# Patient Record
Sex: Female | Born: 1965 | Race: Black or African American | Hispanic: No | Marital: Single | Smoking: Never smoker
Health system: Southern US, Community
[De-identification: ages and names within clinical notes are randomized; demographics above are authoritative.]

## PROBLEM LIST (undated history)

## (undated) DIAGNOSIS — D249 Benign neoplasm of unspecified breast: Secondary | ICD-10-CM

## (undated) DIAGNOSIS — K589 Irritable bowel syndrome without diarrhea: Secondary | ICD-10-CM

## (undated) DIAGNOSIS — C801 Malignant (primary) neoplasm, unspecified: Secondary | ICD-10-CM

## (undated) HISTORY — DX: Malignant (primary) neoplasm, unspecified: C80.1

## (undated) HISTORY — DX: Benign neoplasm of unspecified breast: D24.9

## (undated) HISTORY — DX: Irritable bowel syndrome without diarrhea: K58.9

## (undated) HISTORY — PX: DILATION AND CURETTAGE OF UTERUS: SHX78

---

## 2001-05-26 HISTORY — PX: CHOLECYSTECTOMY: SHX55

## 2001-05-26 HISTORY — PX: OTHER SURGICAL HISTORY: SHX169

## 2001-07-08 ENCOUNTER — Encounter (INDEPENDENT_AMBULATORY_CARE_PROVIDER_SITE_OTHER): Payer: Self-pay | Admitting: Specialist

## 2001-07-08 ENCOUNTER — Observation Stay (HOSPITAL_COMMUNITY): Admission: RE | Admit: 2001-07-08 | Discharge: 2001-07-09 | Payer: Self-pay | Admitting: Gynecology

## 2001-08-15 ENCOUNTER — Emergency Department (HOSPITAL_COMMUNITY): Admission: EM | Admit: 2001-08-15 | Discharge: 2001-08-15 | Payer: Self-pay | Admitting: Emergency Medicine

## 2001-08-15 ENCOUNTER — Encounter: Payer: Self-pay | Admitting: Emergency Medicine

## 2003-03-02 ENCOUNTER — Other Ambulatory Visit: Admission: RE | Admit: 2003-03-02 | Discharge: 2003-03-02 | Payer: Self-pay | Admitting: Diagnostic Radiology

## 2004-03-25 ENCOUNTER — Other Ambulatory Visit: Admission: RE | Admit: 2004-03-25 | Discharge: 2004-03-25 | Payer: Self-pay | Admitting: Gynecology

## 2004-03-28 ENCOUNTER — Encounter: Admission: RE | Admit: 2004-03-28 | Discharge: 2004-03-28 | Payer: Self-pay | Admitting: Gynecology

## 2004-09-17 ENCOUNTER — Other Ambulatory Visit: Admission: RE | Admit: 2004-09-17 | Discharge: 2004-09-17 | Payer: Self-pay | Admitting: Gynecology

## 2004-09-30 ENCOUNTER — Encounter: Admission: RE | Admit: 2004-09-30 | Discharge: 2004-09-30 | Payer: Self-pay | Admitting: Gynecology

## 2005-04-29 ENCOUNTER — Other Ambulatory Visit: Admission: RE | Admit: 2005-04-29 | Discharge: 2005-04-29 | Payer: Self-pay | Admitting: *Deleted

## 2005-05-06 ENCOUNTER — Encounter: Admission: RE | Admit: 2005-05-06 | Discharge: 2005-05-06 | Payer: Self-pay | Admitting: Gynecology

## 2005-11-11 ENCOUNTER — Encounter: Admission: RE | Admit: 2005-11-11 | Discharge: 2005-11-11 | Payer: Self-pay | Admitting: Gynecology

## 2006-05-26 DIAGNOSIS — C801 Malignant (primary) neoplasm, unspecified: Secondary | ICD-10-CM

## 2006-05-26 HISTORY — DX: Malignant (primary) neoplasm, unspecified: C80.1

## 2006-05-26 HISTORY — PX: THYROIDECTOMY: SHX17

## 2006-12-03 ENCOUNTER — Other Ambulatory Visit: Admission: RE | Admit: 2006-12-03 | Discharge: 2006-12-03 | Payer: Self-pay | Admitting: Family Medicine

## 2006-12-08 ENCOUNTER — Encounter: Admission: RE | Admit: 2006-12-08 | Discharge: 2006-12-08 | Payer: Self-pay | Admitting: Family Medicine

## 2007-02-11 ENCOUNTER — Ambulatory Visit (HOSPITAL_COMMUNITY): Admission: RE | Admit: 2007-02-11 | Discharge: 2007-02-12 | Payer: Self-pay | Admitting: General Surgery

## 2007-02-11 ENCOUNTER — Encounter (INDEPENDENT_AMBULATORY_CARE_PROVIDER_SITE_OTHER): Payer: Self-pay | Admitting: General Surgery

## 2007-07-01 ENCOUNTER — Emergency Department (HOSPITAL_COMMUNITY): Admission: EM | Admit: 2007-07-01 | Discharge: 2007-07-01 | Payer: Self-pay | Admitting: Family Medicine

## 2007-12-07 ENCOUNTER — Other Ambulatory Visit: Admission: RE | Admit: 2007-12-07 | Discharge: 2007-12-07 | Payer: Self-pay | Admitting: Family Medicine

## 2007-12-09 ENCOUNTER — Encounter: Admission: RE | Admit: 2007-12-09 | Discharge: 2007-12-09 | Payer: Self-pay | Admitting: Family Medicine

## 2008-11-08 ENCOUNTER — Ambulatory Visit: Payer: Self-pay | Admitting: Gynecology

## 2008-12-11 ENCOUNTER — Encounter: Admission: RE | Admit: 2008-12-11 | Discharge: 2008-12-11 | Payer: Self-pay | Admitting: Gynecology

## 2008-12-13 ENCOUNTER — Ambulatory Visit: Payer: Self-pay | Admitting: Gynecology

## 2008-12-13 ENCOUNTER — Encounter: Payer: Self-pay | Admitting: Gynecology

## 2008-12-13 ENCOUNTER — Other Ambulatory Visit: Admission: RE | Admit: 2008-12-13 | Discharge: 2008-12-13 | Payer: Self-pay | Admitting: Gynecology

## 2008-12-19 ENCOUNTER — Ambulatory Visit: Payer: Self-pay | Admitting: Gynecology

## 2008-12-20 ENCOUNTER — Ambulatory Visit: Payer: Self-pay | Admitting: Gynecology

## 2008-12-20 ENCOUNTER — Inpatient Hospital Stay (HOSPITAL_COMMUNITY): Admission: RE | Admit: 2008-12-20 | Discharge: 2008-12-22 | Payer: Self-pay | Admitting: Obstetrics and Gynecology

## 2008-12-20 ENCOUNTER — Encounter: Payer: Self-pay | Admitting: Gynecology

## 2008-12-20 HISTORY — PX: ABDOMINAL HYSTERECTOMY: SHX81

## 2008-12-25 ENCOUNTER — Ambulatory Visit: Payer: Self-pay | Admitting: Gynecology

## 2009-01-03 ENCOUNTER — Ambulatory Visit: Payer: Self-pay | Admitting: Gynecology

## 2009-01-12 ENCOUNTER — Encounter: Admission: RE | Admit: 2009-01-12 | Discharge: 2009-01-12 | Payer: Self-pay | Admitting: Internal Medicine

## 2009-01-31 ENCOUNTER — Ambulatory Visit: Payer: Self-pay | Admitting: Gynecology

## 2010-01-31 ENCOUNTER — Encounter: Admission: RE | Admit: 2010-01-31 | Discharge: 2010-01-31 | Payer: Self-pay | Admitting: Gynecology

## 2010-06-07 ENCOUNTER — Emergency Department (HOSPITAL_COMMUNITY)
Admission: EM | Admit: 2010-06-07 | Discharge: 2010-06-07 | Payer: Self-pay | Source: Home / Self Care | Admitting: Emergency Medicine

## 2010-06-24 ENCOUNTER — Other Ambulatory Visit: Payer: Self-pay | Admitting: Family Medicine

## 2010-06-24 DIAGNOSIS — M79604 Pain in right leg: Secondary | ICD-10-CM

## 2010-06-26 ENCOUNTER — Encounter: Payer: Self-pay | Admitting: Internal Medicine

## 2010-06-26 ENCOUNTER — Inpatient Hospital Stay: Admission: RE | Admit: 2010-06-26 | Payer: Self-pay | Source: Ambulatory Visit

## 2010-07-02 ENCOUNTER — Ambulatory Visit
Admission: RE | Admit: 2010-07-02 | Discharge: 2010-07-02 | Disposition: A | Discharge status: Home / Self Care | Payer: Managed Care, Other (non HMO) | Source: Ambulatory Visit | Attending: Family Medicine | Admitting: Family Medicine

## 2010-07-02 ENCOUNTER — Other Ambulatory Visit: Payer: Self-pay | Admitting: Family Medicine

## 2010-07-02 ENCOUNTER — Other Ambulatory Visit: Payer: Self-pay

## 2010-07-02 DIAGNOSIS — M79604 Pain in right leg: Secondary | ICD-10-CM

## 2010-09-01 LAB — PREGNANCY, URINE: Preg Test, Ur: NEGATIVE

## 2010-09-01 LAB — CBC
Hemoglobin: 10.5 g/dL — ABNORMAL LOW (ref 12.0–15.0)
MCHC: 33.7 g/dL (ref 30.0–36.0)
RBC: 3.68 MIL/uL — ABNORMAL LOW (ref 3.87–5.11)

## 2010-09-10 ENCOUNTER — Other Ambulatory Visit: Payer: Self-pay | Admitting: Internal Medicine

## 2010-09-10 DIAGNOSIS — C73 Malignant neoplasm of thyroid gland: Secondary | ICD-10-CM

## 2010-09-18 ENCOUNTER — Ambulatory Visit
Admission: RE | Admit: 2010-09-18 | Discharge: 2010-09-18 | Disposition: A | Discharge status: Home / Self Care | Payer: Managed Care, Other (non HMO) | Source: Ambulatory Visit | Attending: Internal Medicine | Admitting: Internal Medicine

## 2010-09-18 DIAGNOSIS — C73 Malignant neoplasm of thyroid gland: Secondary | ICD-10-CM

## 2010-10-08 NOTE — H&P (Signed)
NAME:  Peggy Sawyer, Peggy Sawyer               ACCOUNT NO.:  [id number]   MEDICAL RECORD NO.:  [id number]          PATIENT TYPE:  OIB   LOCATION:  0098                         FACILITY:  [facility]   PHYSICIAN:  Adolph Pollack, M.D.DATE OF BIRTH:  1965-10-17   DATE OF ADMISSION:  02/11/2007  DATE OF DISCHARGE:                              HISTORY & PHYSICAL   REASON:  Thyroidectomy.   HISTORY OF PRESENT ILLNESS:  This is a 45 year old female who has a  palpable right thyroid nodule.  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  She also has a number of nodular lesions in her left  lobe.  I have known her for over a year and recommended that she have  thyroidectomy, but she had other family issues  to take care of, and she  came back the summer of 2008 and now would like to have her surgery.   PAST MEDICAL HISTORY:  1. 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 Gastroesophageal reflux.  3. Hemorrhoids.  4. Gallbladder disease.   PREVIOUS OPERATIONS:  Removal of uterine fibroids; laparoscopic  cholecystectomy.   ALLERGIES:  HYDROCODONE.   MEDICATIONS:  Advil p.r.n., Maalox p.r.n., Tums p.r.n., Pepcid p.r.n.,  multivitamin.   SOCIAL HISTORY:  There is no tobacco use.  Occasional alcohol use.   FAMILY HISTORY:  Notable for hypertension, heart disease, diabetes,  prostate cancer in her brother.   REVIEW OF SYSTEMS:  She has had a little bit of fatigue.   PHYSICAL EXAMINATION:  GENERAL:  A well-developed, well-nourished female  in no acute distress.  She is pleasant and cooperative.  VITAL SIGNS: Temperature is 98.5 degrees, blood pressure is 93/62, pulse  64, respirations 12, O2 saturations on room air 99%.  NECK:  Supple with a palpable right lower neck mass consistent with a  thyroid mass.  No cervical adenopathy.  RESPIRATORY:  Breath sounds equal and clear.  Respirations unlabored.  CARDIOVASCULAR:  Regular rate, regular rhythm.  No murmur heard.  ABDOMEN:  Soft,  nontender.  Small scars noted.  EXTREMITIES:  SCD hose are on.   IMPRESSION:  Hurthle cell neoplasm right lobe of thyroid gland as well  as multiple nodules in the left lobe of thyroid gland.   PLAN:  Total thyroidectomy.  We discussed the procedure and risks  preoperatively.      Adolph Pollack, M.D.  Electronically Signed     TJR/MEDQ  D:  02/11/2007  T:  02/11/2007  Job:  [id number]

## 2010-10-08 NOTE — Op Note (Signed)
NAME:  Peggy Sawyer, Peggy Sawyer               ACCOUNT NO.:  [id number]   MEDICAL RECORD NO.:  [id number]          PATIENT TYPE:  INP   LOCATION:  9302                          FACILITY:  [facility]   PHYSICIAN:  Juan H. Lily Peer, M.D.DATE OF BIRTH:  1965/06/15   DATE OF PROCEDURE:  12/20/2008  DATE OF DISCHARGE:                               OPERATIVE REPORT   SURGEON:  Juan H. Lily Peer, MD   FIRST ASSISTANT:  Rande Brunt. Gottsegen, MD   INDICATIONS FOR OPERATION:  A 45 year old gravida 2, para 1, AB 1 with  symptomatic leiomyomatous uteri contributing to dysmenorrhea,  menorrhagia, and iron-deficiency anemia.   PREOPERATIVE DIAGNOSES:  1. Leiomyomatous uteri.  2. Dysmenorrhea.  3. Menorrhagia.  4. Iron deficiency anemia.   POSTOPERATIVE DIAGNOSES:  1. Leiomyomatous uteri.  2. Dysmenorrhea.  3. Menorrhagia.  4. Iron deficiency anemia.   ANESTHESIA:  General endotracheal anesthesia.   PROCEDURE PERFORMED:  Total abdominal hysterectomy.   FINDINGS:  The patient with 47-20 weeks size multilobulated uterus with  normal-appearing tubes and ovaries.   DESCRIPTION OF OPERATION:  After the patient was adequately counseled,  she was taken to the operating room where she underwent successful  general endotracheal anesthesia.  She had PAS stockings placed for DVT  prophylaxis and also had received a gram of cefotetan IV as well.  After  general endotracheal anesthesia was obtained, the abdomen and perineum  were prepped and draped in usual sterile fashion.  A Foley catheter had  been inserted in effort to monitor urinary output.  A Pfannenstiel  incision was made 2 cm above the symphysis pubis.  The incision was  carried out through skin and subcutaneous tissue down to the rectus  fascia.  A midline nick was made.  The fascia was incised in a  transverse fashion.  The peritoneal cavity was entered cautiously.  The  patient was placed in Trendelenburg position and the O'Connor-O'Sullivan  retractors were placed for retraction.  It was evident that the uterus  appeared to be sucked in with the fibroid as well and posterior cul-de-  sac.  Once the pressure was released, tension and traction was able to  be placed on the uterus to be able to visualize the adnexa.  The right  round ligament was identified, was suture ligated with 0 Vicryl suture  and transected with the Bovie and the anterior bladder flap was  established by incising the flap to the area of the internal cervical  os.  The right tube and ovary were identified and the Heaney clamp was  placed at the proximal junction of the utero-ovarian ligament and  fallopian tube and was transected.  The tube and ovary were left in  place and then the pedicle was secured with a free tie of 0 Vicryl  suture followed by transfixion stitch of 0 Vicryl suture.  Meticulous  dissection in the right parametrium was accomplished with serial  clamping and cutting, and suture ligated with 0 Vicryl suture as we made  our way down from the cardinal and broad ligaments to the level of the  lower uterine segment and  incorporating the uterine arteries which were  clamped, cut and suture ligated with 0 Vicryl suture.  Similar procedure  was carried out on the contralateral side due to the size of the  fibroid, uterus for additional exposure to allow [place] to remove the cervix  and the uterus was amputated and passed over the operative field.  The  remaining cervix was meticulously freed from the bladder anteriorly and  right angles of Heaney clamps were placed and with the use of Jorgenson  scissors, the cervix was excised off the vagina.  Both angles were  secured independently with 0 Vicryl suture in transfixion stitch manner  and the remaining center portion of the vaginal cuff was secured with  figure-of-eight of 0 Vicryl suture.  The pelvic cavity was copiously  irrigated with normal saline solution.  Hemostasis was accomplished with  the  Bovie of small vessels as well as the Arista powder applied as a  hemostatic agent.  Once this was accomplished, the sponge and needle  count were removed and the visceral peritoneum was not reapproximated  with the rectus fascia, was closed with running stitch of 0 Vicryl  suture.  The subcutaneous bleeders were Bovie cauterized.  Skin was  reapproximated with skin clips followed by placing Xeroform gauze and  4x4 dressing.  Prior to putting the dressing on, 0.25% Marcaine was  infiltrated into the incision for a total of 10 mL for postoperative  analgesia.  The patient was extubated and transferred to recovery room  in stable vital signs.  Blood loss was 400 mL.  Urine output 200 mL.  IV  fluids consisted 2500 mL of lactated Ringer's.  Sponge and needle count  were correct.  There weight of the uterus and cervix was 1476.4 g.      Juan H. Lily Peer, M.D.  Electronically Signed     JHF/MEDQ  D:  12/20/2008  T:  12/21/2008  Job:  [id number]

## 2010-10-08 NOTE — H&P (Signed)
NAME:  Peggy Sawyer, Peggy Sawyer               ACCOUNT NO.:  [id number]   MEDICAL RECORD NO.:  [id number]          PATIENT TYPE:  AMB   LOCATION:  SDC                           FACILITY:  [facility]   PHYSICIAN:  Juan H. Lily Peer, M.D.DATE OF BIRTH:  11-03-1965   DATE OF ADMISSION:  DATE OF DISCHARGE:                              HISTORY & PHYSICAL   CHIEF COMPLAINT:  1. Symptomatic leiomyomatous uterus.  2. Dysmenorrhea.  3. Menorrhagia.  4. Iron-deficiency anemia.   HISTORY:  The patient is a 45 year old, gravida 2, gravida 2, para 1, AB 1, who was  seen in the office for preoperative consultation on December 13, 2008.  The  patient had been referred to our practice from [facility] back  on November 09, 2008.  The patient had originally been scheduled for total  abdominal hysterectomy as a result of a symptomatic leiomyomatous uterus  which contributed through dysmenorrhea, menorrhagia, iron-deficiency  anemia, but had to postpone it due to scheduling complex until this  month.  She had an ultrasound that was done in the office on November 08, 2008, which demonstrated she had a 19 x 12 x 9.6 cm uterus with multiple  fibroids with the following measurements 10 x 10 cm, 5 x 5 cm, 2.9 x 1.8  cm, 2.4 x 1.9 cm, and 5.0 x 5.3 cm, and 1.8 x 1.3 cm.  The ovaries had  appeared to be normal.  Endometrial stripe was 8.3.  The patient had  been instructed to continue to be taking her iron supplementation daily.  Her last CBC in the office on November 08, 2008, had demonstrated hemoglobin  and hematocrit of 15.2 and 43.1 with a platelet count of 298,000.   PAST MEDICAL HISTORY:  The patient states that she gets nightmares on  codeine.  She has had one vaginal delivery.  She has had one D and E for  an elective AB.  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 Sharl Ma.  She also had resectoscopic  myomectomy in  2003 for menorrhagia.   FAMILY HISTORY:  Mother with history of stroke and hypertension.  Several members in her family with hypertension and diabetes.   PHYSICAL EXAMINATION:  VITAL SIGNS:  Average weight 190 pounds and blood  pressure was 118/72.  HEENT:  Unremarkable.  NECK:  Supple.  Trachea midline.  No carotid bruits.  No thyromegaly.  LUNGS:  Clear to auscultation without any rhonchi or wheezes.  HEART:  Regular rate and rhythm.  No murmurs or gallops.  BREASTS:  Both breasts are symmetrical in appearance.  No skin  discoloration, nipple inversion, palpable masses, or tenderness.  No  supraclavicular or axillary lymphadenopathy.  ABDOMEN:  Soft and nontender.  Fundal height measuring approximately 6-  18 week size.  PELVIC:  Bartholin, urethra, and Skene's within normal limits.  Vagina  and cervix, no gross lesions on inspection.  Uterus 19 weeks' size.  Difficult to determine the ovaries because of the size of uterus.  RECTAL:  Deferred.   ASSESSMENT:  A 45 year old, gravida 2, para 1, AB 1, with symptomatic  leiomyomatous uteri contributing to dysmenorrhea, menorrhagia, iron-  deficiency anemia and scheduled to undergo total abdominal hysterectomy  with ovarian conservation on Wednesday, December 20, 2008, at [facility].  The pros and cons of the procedure were discussed in detail  with the patient in the office.  Potential risk of deep vein thrombosis  and pulmonary embolism were discussed.  She will have PSA stockings for  prophylaxis.  Also, she will receive antibiotics intravenously for  prophylaxis as well.  Also, in the event of any bleeding that may  require blood transfusion, the patient is fully aware of 100,000 risk of  anaphylactic reaction, hepatitis, or AIDS and also the risk of potential  injury to internal organs have been discussed as well.  I had spoken  with Dr. Talmage Coin, the patient's physician who has been monitoring  her thyroid disease.  The  patient's last thyroid function test here in  the office had demonstrated a TSH of 0.30 which is slightly below the  normal range, but he had determined that this TSH is within desire goal  (0.3-2.0 international units per mL) for management of her thyroid  cancer.  Since she has slight suppression of TSH and an elective surgery  scheduled, he recommended that she hold off her levothyroxine which she  will do the day before her surgery and resume her current dose on the  day after her surgery.  He did not expect any perioperative complication  directly related to her thyroid hormone therapy and will relate this  also to anesthesia.  Her last hemoglobin/hematocrit in the office on  December 13, 2008, was 14.2 and 39.8, platelet count 292,000.  All questions  were answered.  We will follow her accordingly.   PLAN:  The patient is scheduled for total abdominal hysterectomy with  ovarian conservation on Wednesday, December 20, 2008, at 7:30 a.m. at [facility].      Darlington H. Lily Peer, M.D.  Electronically Signed     JHF/MEDQ  D:  12/19/2008  T:  12/19/2008  Job:  [id number]

## 2010-10-08 NOTE — Op Note (Signed)
NAME:  Peggy Sawyer, Peggy Sawyer               ACCOUNT NO.:  [id number]   MEDICAL RECORD NO.:  [id number]          PATIENT TYPE:  OIB   LOCATION:  0098                         FACILITY:  [facility]   PHYSICIAN:  Adolph Pollack, M.D.DATE OF BIRTH:  1966/03/22   DATE OF PROCEDURE:  02/11/2007  DATE OF DISCHARGE:                               OPERATIVE REPORT   PREOPERATIVE DIAGNOSIS:  Hurthle cell neoplasm of thyroid gland.   POSTOPERATIVE DIAGNOSIS:  Hurthle cell neoplasm of thyroid gland.   PROCEDURE:  Total thyroidectomy.   SURGEON:  Adolph Pollack, M.D.   ASSISTANT:  Ovidio Kin, M.D.   ANESTHESIA:  General.   INDICATIONS:  This 45 year old female has needle biopsy-proven Hurthle  cell neoplasm of the right lobe of the thyroid gland as well as multiple  left thyroid nodules and now presents for thyroidectomy.   TECHNIQUE:  She is seen in the holding area and then brought to the  operating room, placed supine on the operating table and a general  anesthetic was administered.  The neck was placed in slight extension.  The neck and upper chest were sterilely prepped and draped.  A  curvilinear incision was made superior to the clavicles, overlying the  skin, subcutaneous tissue and the platysmal muscle.  Subplatysmal flaps  were then raised inferiorly to the sternal notch and superiorly to the  thyroid cartilage.  The precervical fascia was identified in the midline  and divided.  The strap muscles were dissected free from the right lobe  of thyroid gland and the mass.  The inferior thyroidal vessels were  identified and divided close to the thyroid gland, mobilizing the  inferior aspect of the right lobe of thyroid gland.  I then approached  the superior aspect of right lobe of thyroid gland and dissected out the  vessels, divided them between clips.  The suspensory ligament of Allyson Sabal  was carefully mobilized.  I stayed above the plane of the recurrent  laryngeal nerve.  I identified  the inferior parathyroid gland and  mobilized it free from the thyroid, keeping its lateral blood supply  intact, and it remained viable throughout the case.  I thought I also  saw superior thyroid gland as well on the right side.  I carefully  identified the middle thyroidal vessels and divided them close to the  thyroid gland and then was able to dissect the thyroid gland free from  the trachea up toward the isthmus and then remove it and sent it off the  field.  I then further mobilized the isthmus.   The left side was then approached.  The strap muscles were dissected  free from the left lobe of thyroid gland with at least 2 or 3 firm  nodules in it.  I began with the inferior aspect of the left lobe of  thyroid gland, staying close to the capsule and identified the inferior  vessels which were divided.  I then approached the superior aspect of  thyroid gland and identified the superior thyroidal vessels.  Staying on  the thyroid capsule, I isolated the vessels and divided them  as well,  mobilizing the superior aspect.  I identified the recurrent laryngeal  nerve on this side because of thyroid had a little bit more of posterior  extension to it.  I carefully dissected the thyroid free from the  ligament of Berry, clipping small vessels while keeping the recurrent  laryngeal nerve in view.  I then identified the middle thyroidal vessels  and divided them close to the thyroid capsule, allowing me to mobilize  the thyroid onto the trachea.  It was then dissected off the trachea  with cautery.  The right lobe of the thyroid was marked with a suture,  and both right and left lobes and isthmus were sent to pathology.   Following this, I inspected the areas of both sides, and hemostasis was  adequate.  I irrigated out the wounds.  Surgicel was placed in both  sides of the wound.  I then close the strap muscles with interrupted 3-0  Vicryl sutures.  The platysma muscle was closed with  interrupted 3-0  Vicryl sutures.  The skin was closed with a 4-0 Monocryl subcuticular  stitch followed by Steri-Strips and sterile dressings.   She tolerated the procedure well without any apparent complications and  was taken to recovery room in satisfactory condition.      Adolph Pollack, M.D.  Electronically Signed     TJR/MEDQ  D:  02/11/2007  T:  02/11/2007  Job:  [id number]   cc:   Chales Salmon. Abigail Miyamoto, M.D.  Fax: [phone]

## 2010-10-08 NOTE — Discharge Summary (Signed)
NAME:  Peggy Sawyer, Peggy Sawyer               ACCOUNT NO.:  [id number]   MEDICAL RECORD NO.:  [id number]          PATIENT TYPE:  INP   LOCATION:  9302                          FACILITY:  [facility]   PHYSICIAN:  Juan H. Lily Peer, M.D.DATE OF BIRTH:  [birth year]   DATE OF ADMISSION:  12/20/2008  DATE OF DISCHARGE:  12/22/2008                               DISCHARGE SUMMARY   Total days hospitalized for 2.   HISTORY:  This patient is a 45 year old gravida 2, para 1, AB 1 with  symptomatic leiomyomatous uteri contributing dysmenorrhea, menorrhagia  and iron-deficiency anemia, underwent a total abdominal hysterectomy on  the morning of July 28, whereby an 18 to 20-week size uterus was noted  at time of surgery with a weight of 1476.4 g.  The patient did well  intraoperatively, had lost approximately 400 mL intraoperatively.  Her  preoperative hemoglobin had been 14.2, hematocrit 39.8, platelet count  282,000.  First postoperative day, her hemoglobin and hematocrit were  10.5, 31.5 respectively with a platelet count of 184,000.  She did well.  Every 24 hours her Foley catheter was removed.  Her PCA pump was  removed.  She was started on Percocet 1 tablet every 4-6 hours.  She was  also had her Foley catheter removed, had been voiding spontaneously and  ambulating and her diet was gradually advanced to the point that she had  regular diet on her second postoperative day and was spontaneously  passing gas.  Her vital signs continued to be stable.  Her incision site  was intact and she was ready to be discharged home.   FINAL DIAGNOSES:  1. Leiomyomatous uteri.  2. Dysmenorrhea.  3. Menorrhagia.  4. Iron deficiency anemia.   PROCEDURE PERFORMED:  Total abdominal hysterectomy.   FINAL DISPOSITION:  Followup.  The patient was discharged home.  As her  second postoperative day, she was up, ambulating, tolerating regular  diet well.  She is to return back to the office in 72 hours to have her  staples  removed.  She was given a prescription of Darvocet to take 1  p.o. q.4-6 h. p.r.n. and Reglan 10 mg 1 p.o. q.4-6 h. p.r.n. and also  because of some pruritus that she had developed from her Percocet, she  was given prescription of Benadryl 25 mg take 1 p.o. q.4-6 h. p.r.n.      Juan H. Lily Peer, M.D.  Electronically Signed     JHF/MEDQ  D:  12/22/2008  T:  12/23/2008  Job:  [id number]

## 2010-10-11 NOTE — H&P (Signed)
[facility]  Patient:    Peggy Sawyer, Peggy Sawyer Visit Number: [id number] MRN: [id number]          Service Type: Attending:  Douglass Rivers, M.D. Dictated by:   Douglass Rivers, M.D. Adm. Date:  07/08/01                           History and Physical  CHIEF COMPLAINT:              Submucosal fibroid.  HISTORY OF PRESENT ILLNESS:   The patient is a 45 year old G2, P1 with a history of postcoital bleeding, menorrhagia, and dysmenorrhea.  She is noted to have a multi-fibroid uterus.  A sonohysterogram was performed that showed a large endometrial mass that filled the entire cavity measuring 3 x 2.5 x 1.7 cm felt to be most consistent with a subserosal fibroid.  The patient states that on her heaviest days of flow she will change a super plus tampon every one to one and a half hours, which she does for two days.  She also notices an increase amount of clotting.  She and her husband are contemplating another child.  PAST OBSTETRICAL/GYNECOLOGIC HISTORY:                      Significant for regular Pap smears, normal.  One vaginal delivery 14 years ago.  Monthly cycles, condoms for contraception.  PAST MEDICAL HISTORY:         Negative.  PAST SURGICAL HISTORY:        Negative.  MEDICATIONS:                  Depot Lupron.  SOCIAL HISTORY:               No tobacco or alcohol.  Rare caffeine.  No formal exercise.  FAMILY HISTORY:               Negative for cancer.  PHYSICAL EXAMINATION:  GENERAL:                      She is a well-appearing female in no acute distress.  HEENT:                        Unremarkable.  NECK:                         Thyroid is midline, tender, and mobile.  HEART:                        Regular rate.  LUNGS:                        Clear to auscultation.  BREASTS:                      Without mass, discharge, retraction in supine and upright position.  ABDOMEN:                      Soft and nontender without rebound or  guarding.  GYNECOLOGIC:                  Normal external female genitalia.  The BUS is negative.  The vagina is pink and moist.  The cervix is without lesion.  The uterus is difficult to  palpate.  There was some minimal suprapubic tenderness. The adnexa were without fullness.  Rectovaginal exam was remarkable only for extensive hemorrhoids.  LABORATORY DATA:              Ultrasound was performed.  It showed the uterine cavity to be 9 x 5.1 x 5.9 with several hypoechoic foci and intramural fibroids, all less than 24 mm.  The sonohysterogram portion revealed the large endometrial mass which consumed the entire cavity measuring 3.1 x 2.4 x 1.7 cm.  ASSESSMENT:                   Endometrial mass, probable endometrial fibroid, with postcoital bleeding and menorrhagia.  PLAN:                         The patient was suppressed for three months on Lupron and will present today for a hysteroscopic resection of the fibroid with a Versipoint.  Risks and benefits of the procedure were discussed including risk of uterine perforation, damage to underlying bowel and bladder, risk of damage to the transecting vessels, risk of blood clots, possible need for conversion to hysterectomy.  All questions were addressed.  She will present in the morning of February 13. Dictated by:   Douglass Rivers, M.D. Attending:  Douglass Rivers, M.D. DD:  07/07/01 TD:  07/07/01 Job: [id number]

## 2010-10-11 NOTE — Op Note (Signed)
[facility]  Patient:    Peggy Sawyer, Peggy Sawyer Visit Number: [id number] MRN: [id number]          Service Type: DSU Location: [facility] Attending Physician:  Douglass Rivers Dictated by:   Douglass Rivers, M.D. Proc. Date: 07/08/01 Admit Date:  07/08/2001                             Operative Report  PREOPERATIVE DIAGNOSIS:       Submucosal fibroid and menorrhagia.  POSTOPERATIVE DIAGNOSES:      Submucosal fibroid and menorrhagia.  PROCEDURE:                    Hysteroscopic myomectomy with resectoscope and vaportrobe.  SURGEON:                      Douglass Rivers, M.D., Gaetano Hawthorne. Lily Peer, M.D.  ANESTHESIA:                   General.  I&O DEFICIT:                  Sorbitol solution is roughly 100 cc.  FINDINGS:                     Large posterior wall fibroid filling the uterine cavity.  COMPLICATIONS:                None.  PATHOLOGY:                    Fibroid.  PROCEDURE:                    Patient was taken to the operating room, placed in the dorsal lithotomy position, prepped and draped in usual sterile fashion after anesthesia.  A bivalve speculum was placed in the vagina.  The cervix was visualized and stable with a single tooth tenaculum.  A sterile weighted speculum was then placed in the vagina and the uterus was gently sounded to 8 cm.  The cervix was then gently dilated up to 23 [place] and the diagnostic hysteroscope was advanced occupational therapy the cervix.  We were able to visualize that we were able to get around the fibroid.  There was some difficulty clearing debris from the cavity but as we were going to move on to the next step the diagnostic scope was removed and the patient was then future dilated up to 29 [place].  The operative hysteroscope was then advanced through the cervix with the vaportrobe attachment.  The uterine cavity was eventually cleared of blood.  The fibroid was noted to be markedly softened.  We were able to  extend the scope to just superior from the fibroid.  The fibroid was scored gently with a vaportrobe which was set at 200 watts cutting.  A second pass was then performed.  Because the fibroid occupied almost the entire cavity and extended deeply into the lower uterine segment, it was difficult to extend the sleeve without falling out of the uterine cavity and into the cervix and vagina.  We placed the hysteroscope.  There was noted to be a moderate amount of bleeding.  The patient had Lupron, had been off about two weeks.  We did not feel comfortable because of the difficulty in visualization of proceeding with the vaportrobe.  There were multiple sinuses.  The vaportrobe was then removed and  ball cautery was placed necessitating coagulation of the sinuses in and around the fibroid so that we could better visualize.  Prior to doing this, however, suction curette to remove some of the debris was performed after which the cautery was brought down to 80 and the sinuses were individually treated as to not cause too much scarring of the uterine cavity.  This took considerable amount of time.  After we felt that we could visualize, the hysteroscopic loop was then placed on the scope.  The cautery was brought up to 120.  A few passes of the loop were performed. However, because of the bulk of the fibroid and the difficulty in clearing debris, we abandoned this and went back to the vaportrobe.  The particulate was removed with polyp forceps.  The vaportrobe was then replaced and again cut and cautery were brought up to 200.  The fibroid was then gently removed with the vaportrobe with careful attention to the posterior wall.  Once we got more of the fibroid out, again the vaportrobe was removed and the loop was placed.  We were able to rotate the loop and come from the under side and laterally through the fibroid so that we were not dissecting down onto the posterior wall.  The instruments were  then removed.  Polyp forceps were placed in again and curetting was gently performed and it was felt that we had cleared the cavity of the majority, if not entirely, of the fibroid.  There was a good amount of bleeding from sinuses that were again somewhat spot treated but in order to minimize any damage to scarring to the cavity we elected instead to end the procedure and instead place a Foley in the uterine cavity in order to tamponade the walls.  The Foley was filled with 15 cc.  The tip had been cut off prior to placement.  The cervix was inspected and was hemostatic.  The instruments were all then removed.  The patient tolerated the procedure well.  Sponge, lap, and needle counts were correct x2.  She was transferred to the PACU in stable condition. Dictated by:   Douglass Rivers, M.D. Attending Physician:  Douglass Rivers DD:  07/08/01 TD:  07/08/01 Job: [id number]

## 2011-02-14 ENCOUNTER — Other Ambulatory Visit: Payer: Self-pay | Admitting: Family Medicine

## 2011-02-14 DIAGNOSIS — Z1231 Encounter for screening mammogram for malignant neoplasm of breast: Secondary | ICD-10-CM

## 2011-02-18 ENCOUNTER — Ambulatory Visit
Admission: RE | Admit: 2011-02-18 | Discharge: 2011-02-18 | Disposition: A | Discharge status: Home / Self Care | Payer: Managed Care, Other (non HMO) | Source: Ambulatory Visit | Attending: Family Medicine | Admitting: Family Medicine

## 2011-02-18 DIAGNOSIS — Z1231 Encounter for screening mammogram for malignant neoplasm of breast: Secondary | ICD-10-CM

## 2011-03-06 LAB — COMPREHENSIVE METABOLIC PANEL
BUN: 6
Calcium: 9.4
Creatinine, Ser: 0.72
GFR calc Af Amer: >60
GFR calc non Af Amer: >60
Glucose, Bld: 85
Sodium: 142
Total Bilirubin: 0.5
Total Protein: 6.8

## 2011-03-06 LAB — CALCIUM
Calcium: 8.7
Calcium: 8.9

## 2011-03-06 LAB — DIFFERENTIAL
Eosinophils Absolute: 0.2
Eosinophils Relative: 3
Monocytes Absolute: 0.6
Monocytes Relative: 9
Neutro Abs: 4.1

## 2011-03-06 LAB — CBC
HCT: 39.9
MCHC: 33.7
RBC: 4.81
RDW: 13.6
WBC: 7.3

## 2011-03-11 ENCOUNTER — Emergency Department (HOSPITAL_COMMUNITY)
Admission: EM | Admit: 2011-03-11 | Discharge: 2011-03-11 | Disposition: A | Discharge status: Home / Self Care | Payer: Managed Care, Other (non HMO) | Attending: Emergency Medicine | Admitting: Emergency Medicine

## 2011-03-11 ENCOUNTER — Emergency Department (HOSPITAL_COMMUNITY): Payer: Managed Care, Other (non HMO)

## 2011-03-11 DIAGNOSIS — J40 Bronchitis, not specified as acute or chronic: Secondary | ICD-10-CM | POA: Insufficient documentation

## 2011-03-11 DIAGNOSIS — K219 Gastro-esophageal reflux disease without esophagitis: Secondary | ICD-10-CM | POA: Insufficient documentation

## 2011-03-11 DIAGNOSIS — R51 Headache: Secondary | ICD-10-CM | POA: Insufficient documentation

## 2011-03-11 DIAGNOSIS — Z9889 Other specified postprocedural states: Secondary | ICD-10-CM | POA: Insufficient documentation

## 2011-03-11 DIAGNOSIS — Z79899 Other long term (current) drug therapy: Secondary | ICD-10-CM | POA: Insufficient documentation

## 2011-03-11 DIAGNOSIS — R0609 Other forms of dyspnea: Secondary | ICD-10-CM | POA: Insufficient documentation

## 2011-03-11 DIAGNOSIS — R059 Cough, unspecified: Secondary | ICD-10-CM | POA: Insufficient documentation

## 2011-03-11 DIAGNOSIS — R05 Cough: Secondary | ICD-10-CM | POA: Insufficient documentation

## 2011-03-11 DIAGNOSIS — R0789 Other chest pain: Secondary | ICD-10-CM | POA: Insufficient documentation

## 2011-03-11 DIAGNOSIS — Z85850 Personal history of malignant neoplasm of thyroid: Secondary | ICD-10-CM | POA: Insufficient documentation

## 2011-03-11 DIAGNOSIS — R0989 Other specified symptoms and signs involving the circulatory and respiratory systems: Secondary | ICD-10-CM | POA: Insufficient documentation

## 2011-03-11 DIAGNOSIS — R110 Nausea: Secondary | ICD-10-CM | POA: Insufficient documentation

## 2011-03-11 LAB — BASIC METABOLIC PANEL
Calcium: 9.5 mg/dL (ref 8.4–10.5)
Creatinine, Ser: 0.73 mg/dL (ref 0.50–1.10)
GFR calc Af Amer: >90 mL/min (ref >90)
GFR calc non Af Amer: >90 mL/min (ref >90)
Sodium: 139 mEq/L (ref 135–145)

## 2011-03-11 LAB — CBC
HCT: 40.9 % (ref 36.0–46.0)
MCHC: 33 g/dL (ref 30.0–36.0)
Platelets: 183 10*3/uL (ref 150–400)
RDW: 13.2 % (ref 11.5–15.5)
WBC: 13 10*3/uL — ABNORMAL HIGH (ref 4.0–10.5)

## 2011-03-11 LAB — DIFFERENTIAL
Basophils Absolute: 0.1 10*3/uL (ref 0.0–0.1)
Basophils Relative: 0 % (ref 0–1)
Eosinophils Relative: 1 % (ref 0–5)
Lymphocytes Relative: 12 % (ref 12–46)
Monocytes Absolute: 0.7 10*3/uL (ref 0.1–1.0)

## 2011-03-11 LAB — POCT I-STAT TROPONIN I: Troponin i, poc: 0 ng/mL (ref 0.00–0.08)

## 2012-03-31 ENCOUNTER — Other Ambulatory Visit: Payer: Self-pay | Admitting: Family Medicine

## 2012-03-31 DIAGNOSIS — Z1231 Encounter for screening mammogram for malignant neoplasm of breast: Secondary | ICD-10-CM

## 2012-05-10 ENCOUNTER — Ambulatory Visit
Admission: RE | Admit: 2012-05-10 | Discharge: 2012-05-10 | Disposition: A | Discharge status: Home / Self Care | Payer: Managed Care, Other (non HMO) | Source: Ambulatory Visit | Attending: Family Medicine | Admitting: Family Medicine

## 2012-05-10 DIAGNOSIS — Z1231 Encounter for screening mammogram for malignant neoplasm of breast: Secondary | ICD-10-CM

## 2012-05-14 ENCOUNTER — Ambulatory Visit: Payer: Managed Care, Other (non HMO) | Admitting: Gynecology

## 2013-09-26 ENCOUNTER — Other Ambulatory Visit: Payer: Self-pay | Admitting: Internal Medicine

## 2013-09-26 DIAGNOSIS — C73 Malignant neoplasm of thyroid gland: Secondary | ICD-10-CM

## 2013-09-30 ENCOUNTER — Ambulatory Visit
Admission: RE | Admit: 2013-09-30 | Discharge: 2013-09-30 | Disposition: A | Discharge status: Home / Self Care | Payer: Managed Care, Other (non HMO) | Source: Ambulatory Visit | Attending: Internal Medicine | Admitting: Internal Medicine

## 2013-09-30 DIAGNOSIS — C73 Malignant neoplasm of thyroid gland: Secondary | ICD-10-CM

## 2014-02-02 ENCOUNTER — Other Ambulatory Visit: Payer: Self-pay

## 2014-02-02 DIAGNOSIS — Z1231 Encounter for screening mammogram for malignant neoplasm of breast: Secondary | ICD-10-CM

## 2014-02-14 ENCOUNTER — Ambulatory Visit
Admission: RE | Admit: 2014-02-14 | Discharge: 2014-02-14 | Disposition: A | Discharge status: Home / Self Care | Payer: Managed Care, Other (non HMO) | Source: Ambulatory Visit

## 2014-02-14 DIAGNOSIS — Z1231 Encounter for screening mammogram for malignant neoplasm of breast: Secondary | ICD-10-CM

## 2015-04-05 ENCOUNTER — Other Ambulatory Visit: Payer: Self-pay

## 2015-04-05 DIAGNOSIS — Z1231 Encounter for screening mammogram for malignant neoplasm of breast: Secondary | ICD-10-CM

## 2015-04-25 ENCOUNTER — Ambulatory Visit
Admission: RE | Admit: 2015-04-25 | Discharge: 2015-04-25 | Disposition: A | Discharge status: Home / Self Care | Payer: Managed Care, Other (non HMO) | Source: Ambulatory Visit

## 2015-04-25 DIAGNOSIS — Z1231 Encounter for screening mammogram for malignant neoplasm of breast: Secondary | ICD-10-CM

## 2018-01-01 ENCOUNTER — Other Ambulatory Visit: Payer: Self-pay | Admitting: Emergency Medicine

## 2018-01-01 DIAGNOSIS — Z1231 Encounter for screening mammogram for malignant neoplasm of breast: Secondary | ICD-10-CM

## 2018-01-15 ENCOUNTER — Other Ambulatory Visit: Payer: Self-pay | Admitting: Physician Assistant

## 2018-01-15 DIAGNOSIS — K625 Hemorrhage of anus and rectum: Secondary | ICD-10-CM

## 2018-01-15 DIAGNOSIS — R1013 Epigastric pain: Secondary | ICD-10-CM

## 2018-01-15 DIAGNOSIS — R1030 Lower abdominal pain, unspecified: Secondary | ICD-10-CM

## 2018-01-28 ENCOUNTER — Ambulatory Visit: Payer: Managed Care, Other (non HMO)

## 2018-01-28 ENCOUNTER — Ambulatory Visit
Admission: RE | Admit: 2018-01-28 | Discharge: 2018-01-28 | Disposition: A | Discharge status: Home / Self Care | Payer: Managed Care, Other (non HMO) | Source: Ambulatory Visit | Attending: Emergency Medicine | Admitting: Emergency Medicine

## 2018-01-28 ENCOUNTER — Ambulatory Visit
Admission: RE | Admit: 2018-01-28 | Discharge: 2018-01-28 | Disposition: A | Discharge status: Home / Self Care | Payer: Managed Care, Other (non HMO) | Source: Ambulatory Visit | Attending: Physician Assistant | Admitting: Physician Assistant

## 2018-01-28 DIAGNOSIS — K625 Hemorrhage of anus and rectum: Secondary | ICD-10-CM

## 2018-01-28 DIAGNOSIS — R1030 Lower abdominal pain, unspecified: Secondary | ICD-10-CM

## 2018-01-28 DIAGNOSIS — Z1231 Encounter for screening mammogram for malignant neoplasm of breast: Secondary | ICD-10-CM

## 2018-01-28 DIAGNOSIS — R1013 Epigastric pain: Secondary | ICD-10-CM

## 2018-01-28 MED ORDER — IOPAMIDOL (ISOVUE-300) INJECTION 61%
100.0000 mL | Freq: Once | INTRAVENOUS | Status: AC | PRN
  Administered 2018-01-28: 100 mL via INTRAVENOUS

## 2018-02-03 ENCOUNTER — Other Ambulatory Visit: Payer: Self-pay | Admitting: Physician Assistant

## 2018-02-03 DIAGNOSIS — R911 Solitary pulmonary nodule: Secondary | ICD-10-CM

## 2018-02-03 DIAGNOSIS — R9389 Abnormal findings on diagnostic imaging of other specified body structures: Secondary | ICD-10-CM

## 2018-02-10 ENCOUNTER — Ambulatory Visit
Admission: RE | Admit: 2018-02-10 | Discharge: 2018-02-10 | Disposition: A | Discharge status: Home / Self Care | Payer: Managed Care, Other (non HMO) | Source: Ambulatory Visit | Attending: Physician Assistant | Admitting: Physician Assistant

## 2018-02-10 DIAGNOSIS — R911 Solitary pulmonary nodule: Secondary | ICD-10-CM

## 2018-02-10 DIAGNOSIS — R9389 Abnormal findings on diagnostic imaging of other specified body structures: Secondary | ICD-10-CM

## 2018-02-10 MED ORDER — IOPAMIDOL (ISOVUE-300) INJECTION 61%
75.0000 mL | Freq: Once | INTRAVENOUS | Status: AC | PRN
  Administered 2018-02-10: 75 mL via INTRAVENOUS

## 2018-06-10 ENCOUNTER — Other Ambulatory Visit: Payer: Self-pay | Admitting: Family Medicine

## 2018-06-10 DIAGNOSIS — R911 Solitary pulmonary nodule: Secondary | ICD-10-CM

## 2018-06-15 ENCOUNTER — Ambulatory Visit
Admission: RE | Admit: 2018-06-15 | Discharge: 2018-06-15 | Disposition: A | Discharge status: Home / Self Care | Payer: Managed Care, Other (non HMO) | Source: Ambulatory Visit | Attending: Family Medicine | Admitting: Family Medicine

## 2018-06-15 DIAGNOSIS — R911 Solitary pulmonary nodule: Secondary | ICD-10-CM

## 2019-02-24 ENCOUNTER — Other Ambulatory Visit: Payer: Self-pay | Admitting: Family Medicine

## 2019-02-24 DIAGNOSIS — Z1231 Encounter for screening mammogram for malignant neoplasm of breast: Secondary | ICD-10-CM

## 2019-04-13 ENCOUNTER — Ambulatory Visit
Admission: RE | Admit: 2019-04-13 | Discharge: 2019-04-13 | Disposition: A | Discharge status: Home / Self Care | Payer: Managed Care, Other (non HMO) | Source: Ambulatory Visit | Attending: Family Medicine | Admitting: Family Medicine

## 2019-04-13 ENCOUNTER — Other Ambulatory Visit: Payer: Self-pay

## 2019-04-13 DIAGNOSIS — Z1231 Encounter for screening mammogram for malignant neoplasm of breast: Secondary | ICD-10-CM

## 2019-06-02 ENCOUNTER — Ambulatory Visit: Payer: Managed Care, Other (non HMO) | Attending: Internal Medicine

## 2019-06-02 DIAGNOSIS — Z20822 Contact with and (suspected) exposure to covid-19: Secondary | ICD-10-CM

## 2019-06-04 LAB — NOVEL CORONAVIRUS, NAA: SARS-CoV-2, NAA: NOT DETECTED

## 2019-06-08 ENCOUNTER — Other Ambulatory Visit: Payer: Managed Care, Other (non HMO)

## 2019-07-08 IMAGING — CT CT CHEST W/ CM
1 series · 15 of 34 positions shown, 19 images · IV contrast (APPLIED)
Comparison: CT abdomen pelvis 01/28/2018.

CLINICAL DATA: Lung nodule.

EXAM:
CT CHEST WITH CONTRAST
TECHNIQUE: Multidetector CT imaging of the chest was performed during
intravenous contrast administration.
CONTRAST:  75mL [id number] IOPAMIDOL ([id number]) INJECTION 61%

[Series 2: chest w/cm · axial · 0.69mm/px · z∈[-337,-71]mm · 15 of 157 slices shown, 19 images]
[im 12/157  mediastinal]
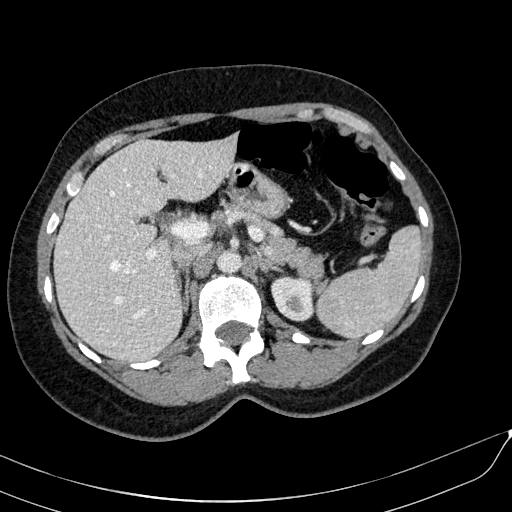
[im 12/157  lung]
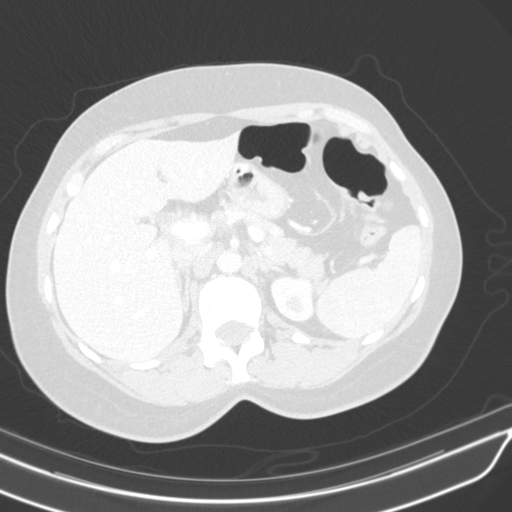
[im 24/157  lung]
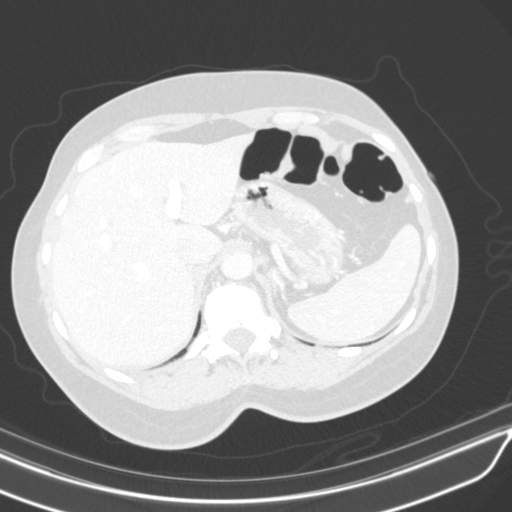
[im 32/157  lung]
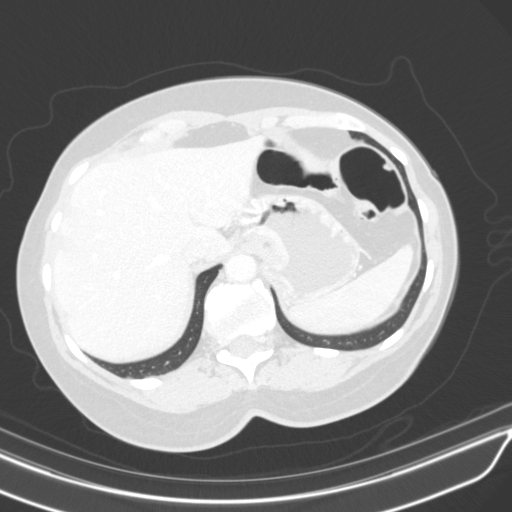
[im 41/157  lung]
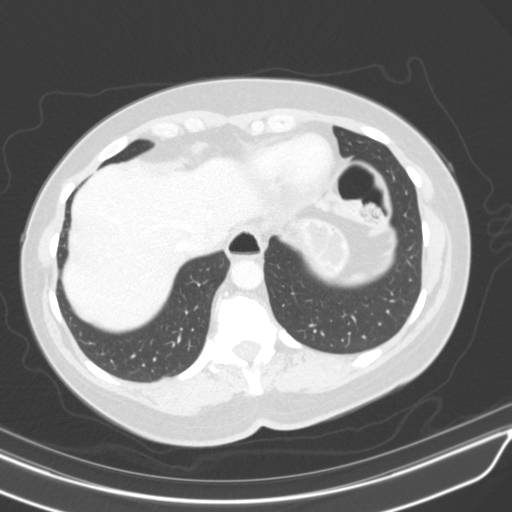
[im 53/157  mediastinal]
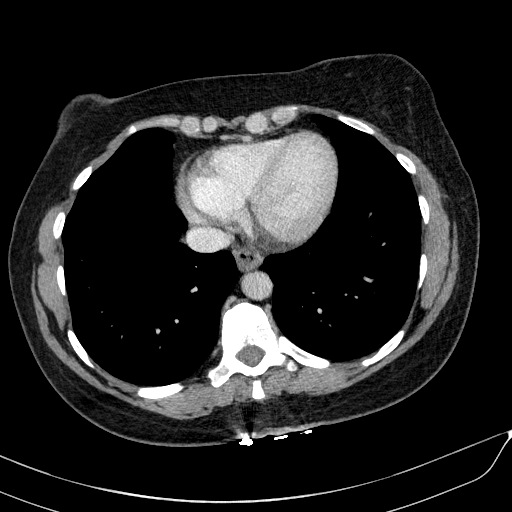
[im 53/157  lung]
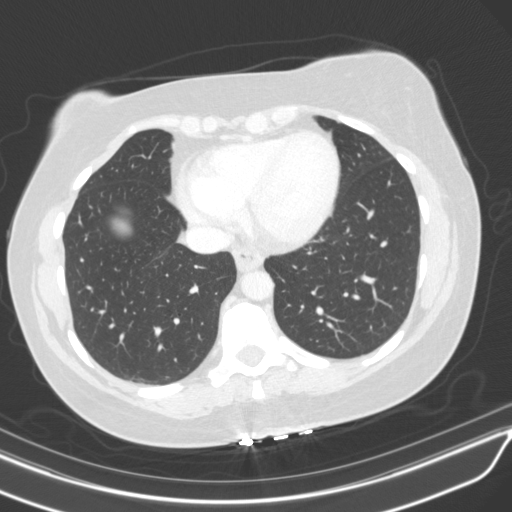
[im 63/157  lung]
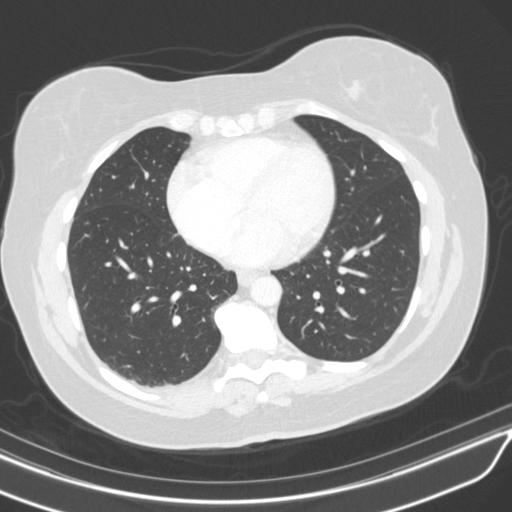
[im 70/157  lung]
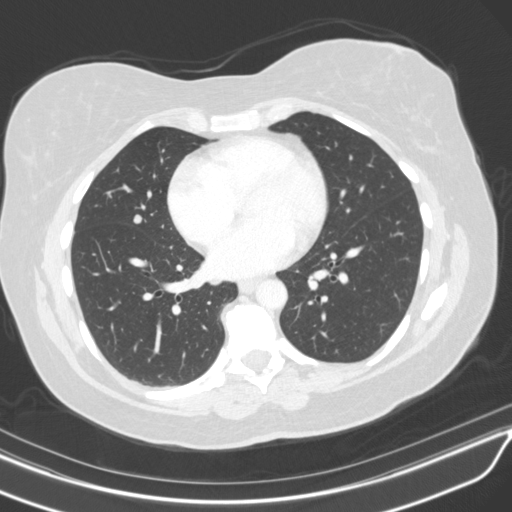
[im 81/157  lung]
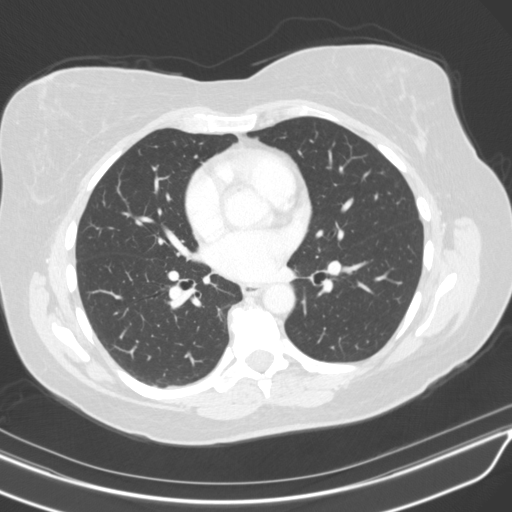
[im 87/157  mediastinal]
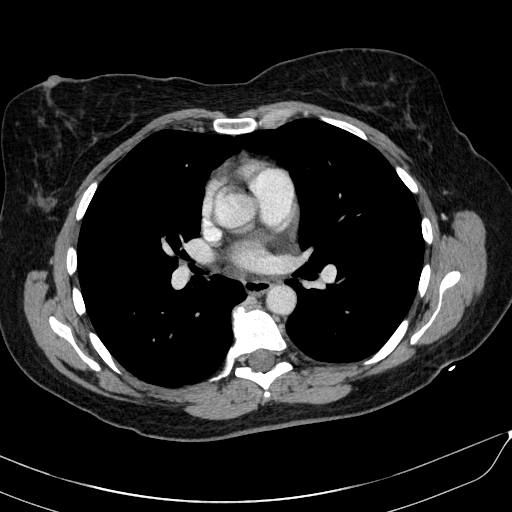
[im 87/157  lung]
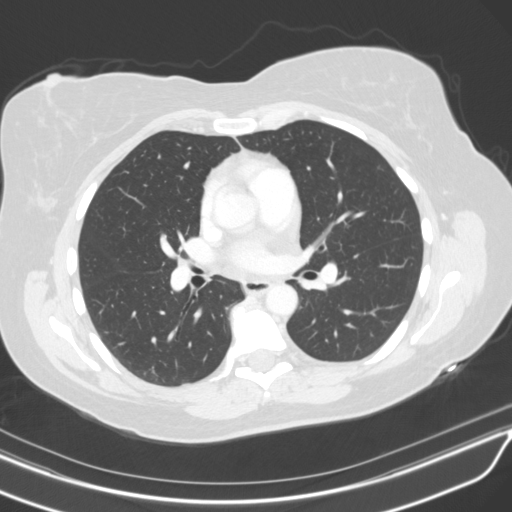
[im 94/157  lung]
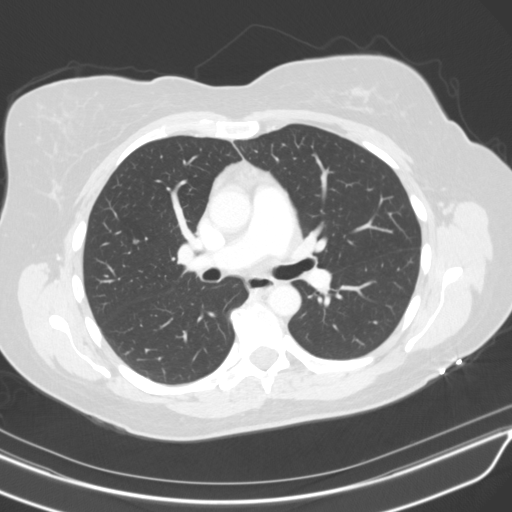
[im 105/157  lung]
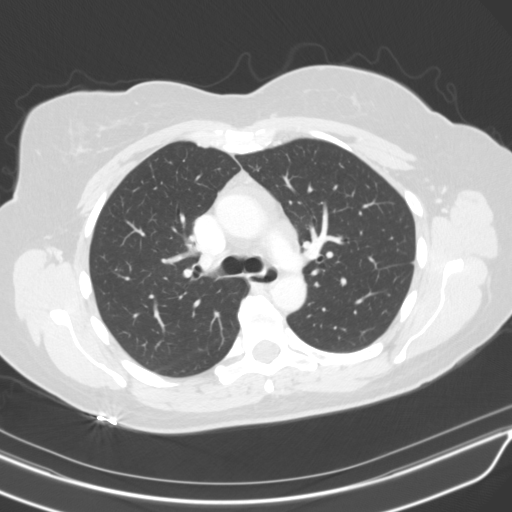
[im 116/157  lung]
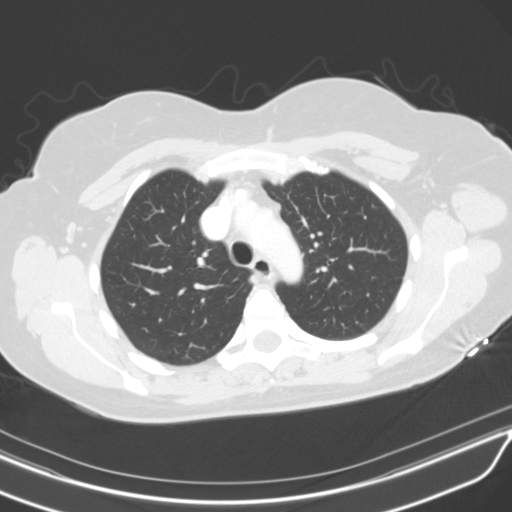
[im 125/157  mediastinal]
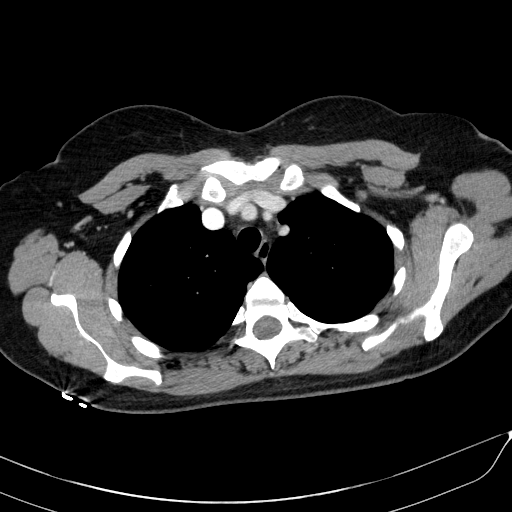
[im 125/157  lung]
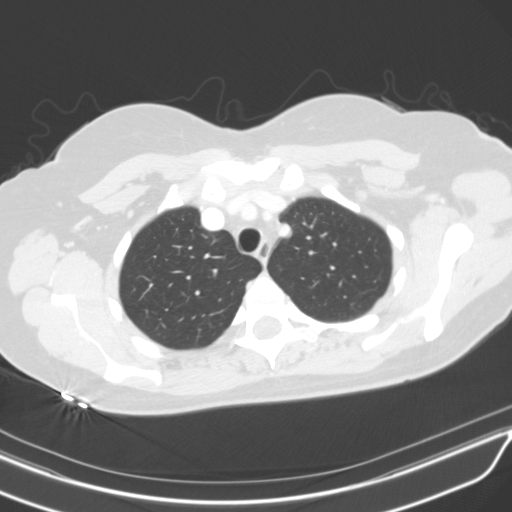
[im 133/157  lung]
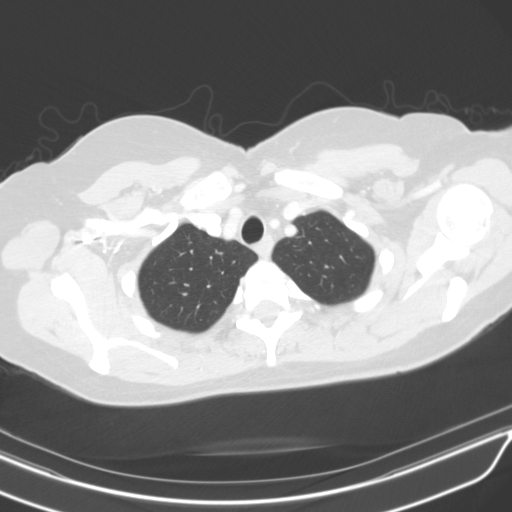
[im 145/157  lung]
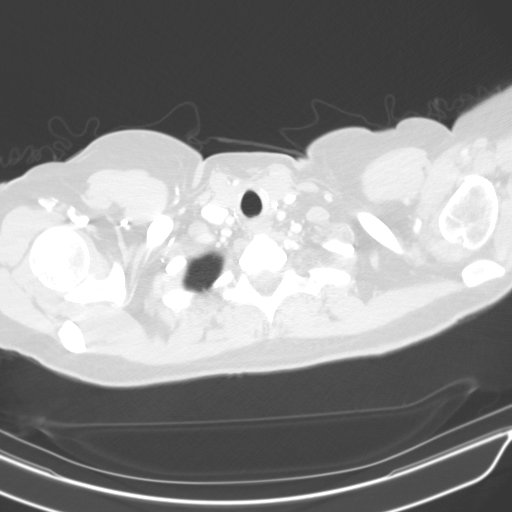

[15 of 34 positions shown; findings below may reference images not displayed]

FINDINGS: Cardiovascular: Vascular structures are unremarkable. Heart size
normal. No pericardial effusion.

Mediastinum/Nodes: Thyroidectomy. No pathologically enlarged
mediastinal, hilar or axillary lymph nodes. Probable thymic tissue
in the prevascular space. Esophagus is grossly unremarkable.

Lungs/Pleura: Right lung nodules measure up to 7 mm in the right
middle lobe. No pleural fluid. Airway is unremarkable.

Upper Abdomen: Visualized portions of the liver, adrenal glands,
left kidney, spleen, pancreas, stomach and bowel are grossly
unremarkable. No upper abdominal adenopathy.

Musculoskeletal: Degenerative changes in the spine. No worrisome
lytic or sclerotic lesions.
IMPRESSION: Right lung nodules measure 7 mm or less in size. This examination
will serve as baseline for future comparison. Non-contrast chest CT
at 3-6 months is recommended. If the nodules are stable at time of
repeat CT, then future CT at 18-24 months (from today's scan) is
considered optional for low-risk patients, but is recommended for
high-risk patients. This recommendation follows the consensus
statement: Guidelines for Management of Incidental Pulmonary Nodules
Detected on CT Images: From the [HOSPITAL] 3708; Radiology

## 2019-08-01 ENCOUNTER — Ambulatory Visit: Payer: Managed Care, Other (non HMO) | Attending: Internal Medicine

## 2019-08-01 DIAGNOSIS — Z23 Encounter for immunization: Secondary | ICD-10-CM | POA: Insufficient documentation

## 2019-08-01 NOTE — Progress Notes (Signed)
   Covid-19 Vaccination Clinic  Name:  JOYDAN BASTEDO    MRN: [id number] DOB: 1966/04/19  08/01/2019  Ms. Bernardy was observed post Covid-19 immunization for 15 minutes without incident. She was provided with Vaccine Information Sheet and instruction to access the V-Safe system.   Ms. Horgen was instructed to call 911 with any severe reactions post vaccine: Marland Kitchen Difficulty breathing  . Swelling of face and throat  . A fast heartbeat  . A bad rash all over body  . Dizziness and weakness   Immunizations Administered    Name Date Dose VIS Date Route   Pfizer COVID-19 Vaccine 08/01/2019  4:24 PM 0.3 mL 05/06/2019 Intramuscular   Manufacturer: [place]   Lot: [id number]   [place]: [id number]

## 2019-08-29 ENCOUNTER — Ambulatory Visit: Payer: Managed Care, Other (non HMO) | Attending: Internal Medicine

## 2019-08-29 DIAGNOSIS — Z23 Encounter for immunization: Secondary | ICD-10-CM

## 2019-08-29 NOTE — Progress Notes (Signed)
   Covid-19 Vaccination Clinic  Name:  Peggy Sawyer    MRN: [id number] DOB: June 08, 1965  08/29/2019  Peggy Sawyer was observed post Covid-19 immunization for 15 minutes without incident. She was provided with Vaccine Information Sheet and instruction to access the V-Safe system.   Peggy Sawyer was instructed to call 911 with any severe reactions post vaccine: Marland Kitchen Difficulty breathing  . Swelling of face and throat  . A fast heartbeat  . A bad rash all over body  . Dizziness and weakness   Immunizations Administered    Name Date Dose VIS Date Route   Pfizer COVID-19 Vaccine 08/29/2019  8:43 AM 0.3 mL 05/06/2019 Intramuscular   Manufacturer: [place]   Lot: [id number]   [place]: [id number]

## 2020-02-22 ENCOUNTER — Other Ambulatory Visit: Payer: Self-pay | Admitting: Family Medicine

## 2020-02-22 DIAGNOSIS — Z1231 Encounter for screening mammogram for malignant neoplasm of breast: Secondary | ICD-10-CM

## 2020-04-17 ENCOUNTER — Ambulatory Visit: Payer: Managed Care, Other (non HMO)

## 2020-04-23 ENCOUNTER — Other Ambulatory Visit: Payer: Self-pay | Admitting: Family Medicine

## 2020-04-23 DIAGNOSIS — R911 Solitary pulmonary nodule: Secondary | ICD-10-CM

## 2020-05-11 ENCOUNTER — Other Ambulatory Visit: Payer: Self-pay | Admitting: Physician Assistant

## 2020-05-11 DIAGNOSIS — R1032 Left lower quadrant pain: Secondary | ICD-10-CM

## 2020-05-31 ENCOUNTER — Ambulatory Visit
Admission: RE | Admit: 2020-05-31 | Discharge: 2020-05-31 | Disposition: A | Discharge status: Home / Self Care | Payer: Managed Care, Other (non HMO) | Source: Ambulatory Visit | Attending: Family Medicine | Admitting: Family Medicine

## 2020-05-31 ENCOUNTER — Other Ambulatory Visit: Payer: Managed Care, Other (non HMO)

## 2020-05-31 ENCOUNTER — Other Ambulatory Visit: Payer: Self-pay

## 2020-05-31 DIAGNOSIS — Z1231 Encounter for screening mammogram for malignant neoplasm of breast: Secondary | ICD-10-CM

## 2020-06-01 ENCOUNTER — Other Ambulatory Visit: Payer: Managed Care, Other (non HMO)

## 2020-06-04 ENCOUNTER — Ambulatory Visit
Admission: RE | Admit: 2020-06-04 | Discharge: 2020-06-04 | Disposition: A | Discharge status: Home / Self Care | Payer: Managed Care, Other (non HMO) | Source: Ambulatory Visit | Attending: Physician Assistant | Admitting: Physician Assistant

## 2020-06-04 ENCOUNTER — Ambulatory Visit
Admission: RE | Admit: 2020-06-04 | Discharge: 2020-06-04 | Disposition: A | Discharge status: Home / Self Care | Payer: Managed Care, Other (non HMO) | Source: Ambulatory Visit | Attending: Family Medicine | Admitting: Family Medicine

## 2020-06-04 DIAGNOSIS — R911 Solitary pulmonary nodule: Secondary | ICD-10-CM

## 2020-06-04 DIAGNOSIS — R1032 Left lower quadrant pain: Secondary | ICD-10-CM

## 2020-06-04 MED ORDER — IOPAMIDOL (ISOVUE-300) INJECTION 61%
100.0000 mL | Freq: Once | INTRAVENOUS | Status: AC | PRN
  Administered 2020-06-04: 100 mL via INTRAVENOUS

## 2021-04-30 ENCOUNTER — Other Ambulatory Visit: Payer: Self-pay | Admitting: Family Medicine

## 2021-04-30 DIAGNOSIS — Z1231 Encounter for screening mammogram for malignant neoplasm of breast: Secondary | ICD-10-CM

## 2021-06-03 ENCOUNTER — Ambulatory Visit: Payer: Managed Care, Other (non HMO)

## 2021-06-12 ENCOUNTER — Ambulatory Visit: Payer: Managed Care, Other (non HMO)

## 2021-07-04 ENCOUNTER — Ambulatory Visit
Admission: RE | Admit: 2021-07-04 | Discharge: 2021-07-04 | Disposition: A | Discharge status: Home / Self Care | Payer: Managed Care, Other (non HMO) | Source: Ambulatory Visit | Attending: Family Medicine | Admitting: Family Medicine

## 2021-07-04 DIAGNOSIS — Z1231 Encounter for screening mammogram for malignant neoplasm of breast: Secondary | ICD-10-CM

## 2022-03-28 ENCOUNTER — Other Ambulatory Visit: Payer: Self-pay

## 2022-03-28 ENCOUNTER — Emergency Department (HOSPITAL_COMMUNITY): Payer: Managed Care, Other (non HMO)

## 2022-03-28 ENCOUNTER — Encounter (HOSPITAL_COMMUNITY): Payer: Self-pay | Admitting: *Deleted

## 2022-03-28 ENCOUNTER — Inpatient Hospital Stay (HOSPITAL_COMMUNITY)
Admission: EM | Admit: 2022-03-28 | Discharge: 2022-03-30 | DRG: 812 | Disposition: A | Discharge status: Home / Self Care | Payer: Managed Care, Other (non HMO) | Attending: Internal Medicine | Admitting: Internal Medicine

## 2022-03-28 DIAGNOSIS — Z8042 Family history of malignant neoplasm of prostate: Secondary | ICD-10-CM | POA: Diagnosis not present

## 2022-03-28 DIAGNOSIS — K589 Irritable bowel syndrome without diarrhea: Secondary | ICD-10-CM | POA: Diagnosis present

## 2022-03-28 DIAGNOSIS — K644 Residual hemorrhoidal skin tags: Secondary | ICD-10-CM | POA: Diagnosis present

## 2022-03-28 DIAGNOSIS — K5900 Constipation, unspecified: Secondary | ICD-10-CM | POA: Diagnosis present

## 2022-03-28 DIAGNOSIS — Z85850 Personal history of malignant neoplasm of thyroid: Secondary | ICD-10-CM | POA: Diagnosis not present

## 2022-03-28 DIAGNOSIS — K922 Gastrointestinal hemorrhage, unspecified: Secondary | ICD-10-CM | POA: Insufficient documentation

## 2022-03-28 DIAGNOSIS — Z833 Family history of diabetes mellitus: Secondary | ICD-10-CM

## 2022-03-28 DIAGNOSIS — Z885 Allergy status to narcotic agent status: Secondary | ICD-10-CM | POA: Diagnosis not present

## 2022-03-28 DIAGNOSIS — D649 Anemia, unspecified: Secondary | ICD-10-CM | POA: Diagnosis not present

## 2022-03-28 DIAGNOSIS — E890 Postprocedural hypothyroidism: Secondary | ICD-10-CM | POA: Diagnosis present

## 2022-03-28 DIAGNOSIS — D509 Iron deficiency anemia, unspecified: Secondary | ICD-10-CM | POA: Diagnosis present

## 2022-03-28 DIAGNOSIS — Z823 Family history of stroke: Secondary | ICD-10-CM

## 2022-03-28 DIAGNOSIS — Z79899 Other long term (current) drug therapy: Secondary | ICD-10-CM | POA: Diagnosis not present

## 2022-03-28 DIAGNOSIS — Z8249 Family history of ischemic heart disease and other diseases of the circulatory system: Secondary | ICD-10-CM | POA: Diagnosis not present

## 2022-03-28 DIAGNOSIS — K648 Other hemorrhoids: Secondary | ICD-10-CM | POA: Diagnosis present

## 2022-03-28 DIAGNOSIS — Z79890 Hormone replacement therapy (postmenopausal): Secondary | ICD-10-CM

## 2022-03-28 LAB — PREPARE RBC (CROSSMATCH)

## 2022-03-28 LAB — COMPREHENSIVE METABOLIC PANEL
ALT: 28 U/L (ref 0–44)
AST: 27 U/L (ref 15–41)
Albumin: 4 g/dL (ref 3.5–5.0)
Alkaline Phosphatase: 49 U/L (ref 38–126)
Anion gap: 10 (ref 5–15)
BUN: 8 mg/dL (ref 6–20)
CO2: 24 mmol/L (ref 22–32)
Calcium: 9.2 mg/dL (ref 8.9–10.3)
Chloride: 104 mmol/L (ref 98–111)
Creatinine, Ser: 0.72 mg/dL (ref 0.44–1.00)
GFR, Estimated: >60 mL/min (ref >60)
Glucose, Bld: 98 mg/dL (ref 70–99)
Potassium: 4 mmol/L (ref 3.5–5.1)
Sodium: 138 mmol/L (ref 135–145)
Total Bilirubin: 0.2 mg/dL — ABNORMAL LOW (ref 0.3–1.2)
Total Protein: 6.7 g/dL (ref 6.5–8.1)

## 2022-03-28 LAB — CBC WITH DIFFERENTIAL/PLATELET
Abs Immature Granulocytes: 0.01 10*3/uL (ref 0.00–0.07)
Basophils Absolute: 0.1 10*3/uL (ref 0.0–0.1)
Basophils Relative: 1 %
Eosinophils Absolute: 0.2 10*3/uL (ref 0.0–0.5)
Eosinophils Relative: 3 %
HCT: 22 % — ABNORMAL LOW (ref 36.0–46.0)
Hemoglobin: 6.3 g/dL — CL (ref 12.0–15.0)
Immature Granulocytes: 0 %
Lymphocytes Relative: 37 %
Lymphs Abs: 2 10*3/uL (ref 0.7–4.0)
MCH: 20.3 pg — ABNORMAL LOW (ref 26.0–34.0)
MCHC: 28.6 g/dL — ABNORMAL LOW (ref 30.0–36.0)
MCV: 71 fL — ABNORMAL LOW (ref 80.0–100.0)
Monocytes Absolute: 0.5 10*3/uL (ref 0.1–1.0)
Monocytes Relative: 9 %
Neutro Abs: 2.8 10*3/uL (ref 1.7–7.7)
Neutrophils Relative %: 50 %
Platelets: 319 10*3/uL (ref 150–400)
RBC: 3.1 MIL/uL — ABNORMAL LOW (ref 3.87–5.11)
RDW: 17.2 % — ABNORMAL HIGH (ref 11.5–15.5)
WBC: 5.6 10*3/uL (ref 4.0–10.5)
nRBC: 0 % (ref 0.0–0.2)

## 2022-03-28 LAB — APTT: aPTT: 26 seconds (ref 24–36)

## 2022-03-28 LAB — ABO/RH: ABO/RH(D): A POS

## 2022-03-28 LAB — TROPONIN I (HIGH SENSITIVITY): Troponin I (High Sensitivity): <2 ng/L (ref <18)

## 2022-03-28 LAB — PROTIME-INR
INR: 1 (ref 0.8–1.2)
Prothrombin Time: 12.6 seconds (ref 11.4–15.2)

## 2022-03-28 MED ORDER — SODIUM CHLORIDE 0.9 % IV SOLN: 10.0000 mL/h | Freq: Once | INTRAVENOUS | Status: DC

## 2022-03-28 MED ORDER — POLYETHYLENE GLYCOL 3350 17 G PO PACK: 17.0000 g | PACK | Freq: Every day | ORAL | Status: DC | PRN

## 2022-03-28 MED ORDER — LEVOTHYROXINE SODIUM 100 MCG PO TABS
100.0000 ug | ORAL_TABLET | Freq: Every day | ORAL | Status: DC
  Administered 2022-03-29 – 2022-03-30 (×2): 100 ug via ORAL
  Filled 2022-03-28 (×2): qty 1

## 2022-03-28 MED ORDER — HYDROCORTISONE ACETATE 25 MG RE SUPP
25.0000 mg | Freq: Two times a day (BID) | RECTAL | Status: DC
  Administered 2022-03-29 – 2022-03-30 (×3): 25 mg via RECTAL
  Filled 2022-03-28 (×4): qty 1

## 2022-03-28 NOTE — ED Provider Triage Note (Signed)
Emergency Medicine Provider Triage Evaluation Note  ZAMARAH ULLMER , a 56 y.o. female  was evaluated in triage.  Pt complains of .  Feeling fatigued for the last week.  States that she has been more short of breath on exertion, but denies any chest pain abdominal pain.  Reports that she has a history of hemorrhoids, but previously had a hysterectomy.  No history of ulcerative colitis, but it had a endoscopic several years back that showed gastritis.  Denies any black stools or vaginal bleeding. Does have BRBPR with stools, but states normal for her given hemorrhoids.   Review of Systems  Positive: Shortness of breath Negative: Chest pain, abdominal pain  Physical Exam  Ht '5\' 7"'$  (1.702 m)   Wt 79.8 kg   BMI 27.57 kg/m  Gen:   Awake, no distress   Resp:  Normal effort  MSK:   Moves extremities without difficulty  Other:             No abdominal ttp  Medical Decision Making  Medically screening exam initiated at 3:35 PM.  Appropriate orders placed.  Lashe Oliveira Ruark was informed that the remainder of the evaluation will be completed by another provider, this initial triage assessment does not replace that evaluation, and the importance of remaining in the ED until their evaluation is complete.     Osvaldo Shipper, [place] 03/28/22 1606

## 2022-03-28 NOTE — ED Triage Notes (Signed)
Symptoms for one month

## 2022-03-28 NOTE — H&P (Signed)
History and Physical    Peggy Sawyer [id number] DOB: [birth year] DOA: 03/28/2022  PCP: Marda Stalker, PA-C (Confirm with patient/family/NH records and if not entered, this has to be entered at [facility] point of entry) Patient coming from: Home  I have personally briefly reviewed patient's old medical records in [place]  Chief Complaint: Feeling tired.  HPI: Peggy Sawyer is a 56 y.o. female with medical history significant of thyroid cancer status post thyroidectomy, IBS, chronic iron deficiency anemia secondary to hemorrhoidal bleeding, sent from PCPs office for evaluation of worsening of anemia.  Patient has had intermittent rectal bleeding associated with intermittent rectal pain, she reported worsening of her hemorrhoid bleeding recently along with frequent constipation.  She has had bright red rectal bleeding, blood dripping and blood coating stool up to 2-3 times a week. She could not tolerate MiraLAX due to her IBS symptoms.  She denies any lightheadedness or shortness of breath or chest pains.  Today, she went to see her PCP who did a CBC showed hemoglobin 6 and sent her to ED.  She has been feeling increasingly fatigued and generalized weakness for the past few weeks.  ED Course: Vitals are stable, no tachycardia no hypotension.  No hypoxia.  Repeat hemoglobin 6.3.  PRBC x2 is being given.  Review of Systems: As per HPI otherwise 14 point review of systems negative.    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 She has never used smokeless tobacco. She reports current drug use. No history on file for alcohol use.  Allergies  Allergen Reactions   Codeine     NIGHTMARES    Family History  Problem Relation Age of Onset   Hypertension Mother    Stroke Mother    Hypertension Father    Diabetes Sister    Cancer Brother        PROSTATE   Diabetes Maternal Aunt    Hypertension Maternal Aunt    Hypertension Maternal Uncle    Hypertension Maternal Grandmother    Hypertension Maternal Grandfather      Prior to Admission medications   Medication Sig Start Date End Date Taking? Authorizing Provider  levothyroxine (SYNTHROID) 100 MCG tablet Take by mouth.    [provider]    Physical Exam: Vitals:   03/28/22 1532 03/28/22 1536 03/28/22 1745 03/28/22 1800  BP:  108/73 (!) 119/55 94/63  Pulse:  83 82 71  Resp:  16 (!) 26 12  Temp:  98.3 F (36.8 C)    SpO2:  100% 99% 100%  Weight: 79.8 kg     Height: '5\' 7"'$  (1.702 m)       Constitutional: NAD, calm, comfortable Vitals:   03/28/22 1532 03/28/22 1536 03/28/22 1745 03/28/22 1800  BP:  108/73 (!) 119/55 94/63  Pulse:  83 82 71  Resp:  16 (!) 26 12  Temp:  98.3 F (36.8 C)    SpO2:  100% 99%  100%  Weight: 79.8 kg     Height: '5\' 7"'$  (1.702 m)      Eyes: PERRL, lids and conjunctivae normal ENMT: Mucous membranes are moist. Posterior pharynx clear of any exudate or lesions.Normal dentition.  Neck: normal, supple, no masses, no thyromegaly Respiratory: clear to auscultation bilaterally, no wheezing, no crackles. Normal respiratory effort. No accessory muscle use.  Cardiovascular: Regular rate and rhythm, no murmurs / rubs / gallops. No extremity edema. 2+ pedal pulses. No carotid bruits.  Abdomen: no tenderness, no masses palpated. No hepatosplenomegaly. Bowel sounds positive.  Musculoskeletal: no clubbing / cyanosis. No joint deformity upper and lower extremities. Good ROM, no contractures. Normal muscle tone.   Skin: no rashes, lesions, ulcers. No induration Neurologic: CN 2-12 grossly intact. Sensation intact, DTR normal. Strength 5/5 in all 4.  Psychiatric: Normal judgment and insight. Alert and oriented x 3. Normal mood.    Labs on Admission: I have personally reviewed following labs and imaging studies  CBC: Recent Labs  Lab 03/28/22 1545  WBC 5.6  NEUTROABS 2.8  HGB 6.3*  HCT 22.0*  MCV 71.0*  PLT 656   Basic Metabolic Panel: Recent Labs  Lab 03/28/22 1545  NA 138  K 4.0  CL 104  CO2 24  GLUCOSE 98  BUN 8  CREATININE 0.72  CALCIUM 9.2   GFR: Estimated Creatinine Clearance: 84.4 mL/min (by C-G formula based on SCr of 0.72 mg/dL). Liver Function Tests: Recent Labs  Lab 03/28/22 1545  AST 27  ALT 28  ALKPHOS 49  BILITOT 0.2*  PROT 6.7  ALBUMIN 4.0   No results for input(s): "LIPASE", "AMYLASE" in the last 168 hours. No results for input(s): "AMMONIA" in the last 168 hours. Coagulation Profile: Recent Labs  Lab 03/28/22 1545  INR 1.0   Cardiac Enzymes: No results for input(s): "CKTOTAL", "CKMB", "CKMBINDEX", "TROPONINI" in the last 168 hours. BNP (last 3 results) No results for input(s): "PROBNP" in the last 8760 hours. HbA1C: No results for input(s): "HGBA1C" in the last 72 hours. CBG: No results for input(s): "GLUCAP" in the last 168 hours. Lipid Profile: No results for input(s): "CHOL", "HDL", "LDLCALC", "TRIG", "CHOLHDL", "LDLDIRECT" in the last 72 hours. Thyroid Function Tests: No results for input(s): "TSH", "T4TOTAL", "FREET4", "T3FREE", "THYROIDAB" in the last 72 hours. Anemia Panel: No results for input(s): "VITAMINB12", "FOLATE", "FERRITIN", "TIBC", "IRON", "RETICCTPCT" in the last 72 hours. Urine analysis: No results found for: "COLORURINE", "APPEARANCEUR", "LABSPEC", "PHURINE", "GLUCOSEU", "HGBUR", "BILIRUBINUR", "KETONESUR", "PROTEINUR", "UROBILINOGEN", "NITRITE", "LEUKOCYTESUR"  Radiological Exams on Admission: DG Chest 1 View  Result  Date: 03/28/2022 CLINICAL DATA:  Dyspnea EXAM: CHEST  1 VIEW COMPARISON:  03/28/2022 at 1148 hours FINDINGS: The heart size and mediastinal contours are within normal limits. Both lungs are clear. The visualized skeletal structures are unremarkable. IMPRESSION: No active disease. Electronically Signed   By: Davina Poke D.O.   On: 03/28/2022 16:22    EKG: Ordered  Assessment/Plan Principal Problem:   Symptomatic anemia Active Problems:   Iron deficiency anemia   Lower GI bleed  (please populate well all problems here in Problem List. (For example, if patient is on BP meds at home and you resume or decide to hold them, it is a problem that needs to be her. Same for CAD, COPD, HLD and so on)  Symptomatic anemia, acute on chronic -Microcytic, 2/2 iron deficiency from chronic intermittent lower GI bleed from combined internal and external hemorrhoids. -PRBC x2, recheck H&H tonight and tomorrow morning -Start hydrocortisone suppository twice  daily -Check iron level, may need to resume iron supplement.  Patient agreed. [facility] consulted and will see the patient in AM.  Hypothyroid -Continue home dose of synthroid.   DVT prophylaxis: SCD Code Status: Full code Family Communication: None at bedside Disposition Plan: Expect 2 days of hospital stay for inpatient GI workup. Consults called: [facility] Admission status: Medsurg admit   Lequita Halt MD [facility] Pager [phone]  03/28/2022, 7:06 PM

## 2022-03-28 NOTE — ED Provider Notes (Signed)
[facility] EMERGENCY DEPARTMENT Provider Note   CSN: [id number] Arrival date & time: 03/28/22  1501     History  Chief Complaint  Patient presents with   iron low    Peggy Sawyer is a 56 y.o. female.  HPI   Patient presented to the ED for evaluation of a low blood count.  Patient states she has a history of intermittent rectal bleeding.  She has noticed blood in her stool recently.  Patient states previously she had a GI work-up and was told she had hemorrhoids.  Patient went to her doctor's office today and had her hemoglobin tested.  She was called back and notified that her hemoglobin was very low at 6.7 and she needed to come to hospital for blood transfusion.  Patient denies any trouble with chest pain or shortness of breath.  No abdominal pain.  Home Medications Prior to Admission medications   Not on File      Allergies    Codeine    Review of Systems   Review of Systems  Physical Exam Updated Vital Signs BP 94/63   Pulse 71   Temp 98.3 F (36.8 C)   Resp 12   Ht 1.702 m ('5\' 7"'$ )   Wt 79.8 kg   SpO2 100%   BMI 27.57 kg/m  Physical Exam Vitals and nursing note reviewed.  Constitutional:      General: She is not in acute distress.    Appearance: She is well-developed.  HENT:     Head: Normocephalic and atraumatic.     Right Ear: External ear normal.     Left Ear: External ear normal.  Eyes:     General: No scleral icterus.       Right eye: No discharge.        Left eye: No discharge.     Conjunctiva/sclera: Conjunctivae normal.  Neck:     Trachea: No tracheal deviation.  Cardiovascular:     Rate and Rhythm: Normal rate and regular rhythm.  Pulmonary:     Effort: Pulmonary effort is normal. No respiratory distress.     Breath sounds: Normal breath sounds. No stridor. No wheezing or rales.  Abdominal:     General: Bowel sounds are normal. There is no distension.     Palpations: Abdomen is soft.     Tenderness: There is no  abdominal tenderness. There is no guarding or rebound.  Genitourinary:    Comments: No gross blood noted on rectal exam Musculoskeletal:        General: No tenderness or deformity.     Cervical back: Neck supple.  Skin:    General: Skin is warm and dry.     Findings: No rash.  Neurological:     General: No focal deficit present.     Mental Status: She is alert.     Cranial Nerves: No cranial nerve deficit (no facial droop, extraocular movements intact, no slurred speech).     Sensory: No sensory deficit.     Motor: No abnormal muscle tone or seizure activity.     Coordination: Coordination normal.  Psychiatric:        Mood and Affect: Mood normal.     ED Results / Procedures / Treatments   Labs (all labs ordered are listed, but only abnormal results are displayed) Labs Reviewed  CBC WITH DIFFERENTIAL/PLATELET - Abnormal; Notable for the following components:      Result Value   RBC 3.10 (*)  Hemoglobin 6.3 (*)    HCT 22.0 (*)    MCV 71.0 (*)    MCH 20.3 (*)    MCHC 28.6 (*)    RDW 17.2 (*)    All other components within normal limits  COMPREHENSIVE METABOLIC PANEL - Abnormal; Notable for the following components:   Total Bilirubin 0.2 (*)    All other components within normal limits  PROTIME-INR  APTT  OCCULT BLOOD X 1 CARD TO LAB, STOOL  IRON AND TIBC  FERRITIN  RETICULOCYTES  TYPE AND SCREEN  ABO/RH  PREPARE RBC (CROSSMATCH)  TROPONIN I (HIGH SENSITIVITY)    EKG None  Radiology DG Chest 1 View  Result Date: 03/28/2022 CLINICAL DATA:  Dyspnea EXAM: CHEST  1 VIEW COMPARISON:  03/28/2022 at 1148 hours FINDINGS: The heart size and mediastinal contours are within normal limits. Both lungs are clear. The visualized skeletal structures are unremarkable. IMPRESSION: No active disease. Electronically Signed   By: Davina Poke D.O.   On: 03/28/2022 16:22    Procedures Procedures    Medications Ordered in ED Medications  0.9 %  sodium chloride infusion  (has no administration in time range)    ED Course/ Medical Decision Making/ A&P Clinical Course as of 03/28/22 1902  Ludwig Clarks Mar 28, 2022  1854 Case discussed with Dr. Roosevelt Locks regarding admission [JK]  1859 CBC with Differential(!!) Hemoglobin decreased 6.3 [JK]  1859 Comprehensive metabolic panel(!) Metabolic panel unremarkable [JK]    Clinical Course User Index [JK] Dorie Rank, MD                           Medical Decision Making Problems Addressed: Anemia, unspecified type: acute illness or injury that poses a threat to life or bodily functions  Amount and/or Complexity of Data Reviewed Labs: ordered. Decision-making details documented in ED Course.  Risk Prescription drug management. Decision regarding hospitalization.   Patient presented to the ED for evaluation of anemia.  Hemoglobin is decreased at 6.3.  She has noticed blood in her stool.  Concerned about the possibility of GI bleeding.  No gross blood noted on rectal exam.  Hemoccult is pending.  Patient is hemodynamically stable in no acute distress.  I have requested a GI consult.  I spoke with Dr. Roosevelt Locks and plan is admission to the hospital for further work-up.        Final Clinical Impression(s) / ED Diagnoses Final diagnoses:  Anemia, unspecified type    Rx / DC Orders ED Discharge Orders     None         Dorie Rank, MD 03/28/22 1902

## 2022-03-28 NOTE — ED Triage Notes (Signed)
The pt was sent here from her doctors office and she was told her iron was low and she was told to come to the ed for maybe a blood transfusion

## 2022-03-29 LAB — CBC
HCT: 28.7 % — ABNORMAL LOW (ref 36.0–46.0)
Hemoglobin: 8.8 g/dL — ABNORMAL LOW (ref 12.0–15.0)
MCH: 22.6 pg — ABNORMAL LOW (ref 26.0–34.0)
MCHC: 30.7 g/dL (ref 30.0–36.0)
MCV: 73.8 fL — ABNORMAL LOW (ref 80.0–100.0)
Platelets: 264 10*3/uL (ref 150–400)
RBC: 3.89 MIL/uL (ref 3.87–5.11)
RDW: 18 % — ABNORMAL HIGH (ref 11.5–15.5)
WBC: 4.8 10*3/uL (ref 4.0–10.5)
nRBC: 0 % (ref 0.0–0.2)

## 2022-03-29 LAB — HIV ANTIBODY (ROUTINE TESTING W REFLEX): HIV Screen 4th Generation wRfx: NONREACTIVE

## 2022-03-29 LAB — IRON AND TIBC
Iron: 45 ug/dL (ref 28–170)
Saturation Ratios: 8 % — ABNORMAL LOW (ref 10.4–31.8)
TIBC: 536 ug/dL — ABNORMAL HIGH (ref 250–450)
UIBC: 491 ug/dL

## 2022-03-29 LAB — RETICULOCYTES
Immature Retic Fract: 26.9 % — ABNORMAL HIGH (ref 2.3–15.9)
RBC.: 3.9 MIL/uL (ref 3.87–5.11)
Retic Count, Absolute: 85.4 10*3/uL (ref 19.0–186.0)
Retic Ct Pct: 2.2 % (ref 0.4–3.1)

## 2022-03-29 LAB — FERRITIN: Ferritin: 4 ng/mL — ABNORMAL LOW (ref 11–307)

## 2022-03-29 MED ORDER — POLYETHYLENE GLYCOL 3350 17 G PO PACK
17.0000 g | PACK | Freq: Every day | ORAL | Status: DC
  Administered 2022-03-29 – 2022-03-30 (×2): 17 g via ORAL
  Filled 2022-03-29 (×2): qty 1

## 2022-03-29 MED ORDER — ORAL CARE MOUTH RINSE: 15.0000 mL | OROMUCOSAL | Status: DC | PRN

## 2022-03-29 MED ORDER — FERROUS SULFATE 325 (65 FE) MG PO TABS
325.0000 mg | ORAL_TABLET | Freq: Two times a day (BID) | ORAL | Status: DC
  Administered 2022-03-29 – 2022-03-30 (×2): 325 mg via ORAL
  Filled 2022-03-29 (×2): qty 1

## 2022-03-29 MED ORDER — SENNA 8.6 MG PO TABS
1.0000 | ORAL_TABLET | Freq: Every day | ORAL | Status: DC
  Administered 2022-03-29 – 2022-03-30 (×2): 8.6 mg via ORAL
  Filled 2022-03-29 (×2): qty 1

## 2022-03-29 NOTE — Progress Notes (Signed)
New Admission Note:  Arrival Method: By bed from ED around 2040 Mental Orientation: Alert and oriented x 4  Telemetry: None Assessment: Completed Skin: Completed, refer to flowsheets IV: Left AC S.L. Pain: Denies Tubes: None Safety Measures: Safety Fall Prevention Plan was given, discussed and signed. Admission: Completed [facility] Orientation: Patient has been orientated to the room, unit and the staff. Family: Significant other at bedside  Orders have been reviewed and implemented. Will continue to monitor the patient. Call light has been placed within reach and bed alarm has been activated.   Fara Olden, RN  Phone Number: [phone]

## 2022-03-29 NOTE — Progress Notes (Signed)
Consultation Progress Note   Patient: Peggy Sawyer EUM:[id number] DOB: March 24, 1966 DOA: 03/28/2022 DOS: the patient was seen and examined on 03/29/2022 Primary service: Liberti Appleton, Manfred Shirts, MD  Brief hospital course: SHOSHANNA MCQUITTY is a 56 y.o. female with medical history significant of thyroid cancer status post thyroidectomy, IBS, chronic iron deficiency anemia secondary to hemorrhoidal bleeding, sent from PCPs office for evaluation of worsening of anemia.    Assessment and Plan: Principal Problem:   Symptomatic anemia Active Problems:   Iron deficiency anemia   Lower GI bleed  Symptomatic anemia, acute on chronic -Microcytic, 2/2 iron deficiency from chronic intermittent lower GI bleed from combined internal and external hemorrhoids. -PRBC x2, Hb at 8.8 recheck hb tonight, started on clears, NPO past MN -Start hydrocortisone suppository twice daily - iron level at 45, o resume iron supplement.  [facility] consulted  -Outpatient with surgery for hemorrhoids. mgt   Hypothyroid -Continue home dose of synthroid.       [facility] will continue to follow the patient.  Subjective: Requesting to eat, denies abd pain, no BM yet.  Physical Exam: Gen-Alert in no distress Eyes: PERRL, lids and conjunctivae normal ENMT: Mucous membranes are moist. Posterior pharynx clear of any exudate or lesions.Normal dentition.  Neck: normal, supple, no masses, no thyromegaly Respiratory: clear to auscultation bilaterally, no wheezing, no crackles. Normal respiratory effort. No accessory muscle use.  Cardiovascular: Regular rate and rhythm, no murmurs / rubs / gallops. No extremity edema. 2+ pedal pulses. No carotid bruits.  Abdomen: no tenderness, no masses palpated. No hepatosplenomegaly. Bowel sounds positive.  Musculoskeletal: no clubbing / cyanosis. No joint deformity upper and lower extremities. Good ROM, no contractures. Normal muscle tone.  Skin: no rashes, lesions, ulcers. No  induration Neurologic: CN 2-12 grossly intact. Sensation intact, DTR normal. Strength 5/5 in all 4.  Psychiatric: Normal judgment and insight. Alert and oriented x 3. Normal mood.    Vitals:   03/29/22 0049 03/29/22 0104 03/29/22 0430 03/29/22 0926  BP: (!) 93/54 (!) 97/56 94/65 110/65  Pulse: 72 65 72 (!) 59  Resp: 18   18  Temp: 98.3 F (36.8 C) 98.1 F (36.7 C) 98.1 F (36.7 C) 98.4 F (36.9 C)  TempSrc: Oral Oral Oral Oral  SpO2: 98% 100% 98% 100%  Weight:      Height:        Data Reviewed:  There are no new results to review at this time.  Family Communication: Family at bedside  Time spent: 15 minutes.  Author: Cristela Felt, MD 03/29/2022 1:03 PM  For on call review [email or web address].

## 2022-03-29 NOTE — Progress Notes (Signed)
Was told by lab that pt needs to be NPO for 8 hours for lab to be drawn at 0300. Pt informed. Last po med was given at 1615.  Peggy Sawyer

## 2022-03-29 NOTE — Progress Notes (Signed)
Pt refused H/H at this time. HGB is 8.8. Pt stated she was told by MD that it was good and she does not want to have drawn at this time.   Peggy Sawyer Peggy Sawyer

## 2022-03-29 NOTE — Consult Note (Signed)
[facility] Consult  Referring Provider: [facility] hospitalist Primary Care Physician:  Marda Stalker, PA-C Primary Gastroenterologist: Dr. Alessandra Bevels  Reason for Consultation: Rectal bleeding, anemia  HPI: Peggy Sawyer is a 56 y.o. female states that she was in her usual state of health until about a month ago when she developed progressively worsening exertional shortness of breath and went to her primary care physician where she had blood work done which showed severe anemia.  She was advised to proceed to the ER for blood transfusion.  Patient reports intermittent but large volume bright red blood per rectum several days a week, which is noted in the toilet bowl and also on wiping. Patient reports she has struggled with hemorrhoids since childbirth, almost 34 years ago. She normally has a bowel movement every 3 to 4 days however when she is traveling she may not have a bowel movement for 5 to 6 days and then will have large amount of rectal bleeding. Patient states that she can only have a bowel movement if she is not stressed, at home, sits on the commode for a very long time but this will cause large volume of rectal bleeding.  Prior GI work-up: Colonoscopy 06/28/2020, Dr. Alessandra Bevels showed internal hemorrhoids, no polyps, repeat recommended in 10 years Endoscopy 06/28/2020, Dr. Alessandra Bevels showed biliary gastritis, no H. pylori, no celiac disease.   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 Authorizing Provider  b complex vitamins capsule Take 1 capsule by mouth daily.   Yes [provider]  levothyroxine (SYNTHROID) 100 MCG tablet Take 100 mcg by mouth daily before breakfast.   Yes [provider]  polyethylene glycol (MIRALAX / GLYCOLAX) 17 g packet Take 17 g by mouth daily.   Yes [provider]    Current Facility-Administered Medications  Medication Dose Route Frequency Provider Last Rate Last Admin   0.9 %  sodium chloride infusion  10 mL/hr Intravenous Once Dorie Rank, MD       ferrous sulfate tablet 325 mg  325 mg Oral BID WC Dibia, Manfred Shirts, MD       hydrocortisone (ANUSOL-HC) suppository 25 mg  25 mg Rectal BID Wynetta Fines T, MD   25 mg at 03/29/22 6754   levothyroxine (SYNTHROID) tablet 100 mcg  100 mcg Oral Q0600 Lequita Halt, MD   100 mcg at 03/29/22 4920   Oral care mouth rinse  15 mL Mouth Rinse PRN Dibia, Manfred Shirts, MD       polyethylene glycol (MIRALAX / GLYCOLAX) packet 17 g  17 g Oral Daily Dibia, Manfred Shirts, MD       senna (SENOKOT) tablet 8.6 mg  1 tablet Oral Daily Dibia, Manfred Shirts, MD        Allergies as of 03/28/2022 - Review Complete 03/28/2022  Allergen Reaction Noted   Codeine  05/06/2012    Family History  Problem Relation Age of Onset  Hypertension Mother    Stroke Mother    Hypertension Father    Diabetes Sister    Cancer Brother        PROSTATE   Diabetes Maternal Aunt    Hypertension Maternal Aunt    Hypertension Maternal Uncle    Hypertension Maternal Grandmother    Hypertension Maternal Grandfather     Social History   Socioeconomic History   Marital status: Single    Spouse name: Not on file   Number of children: Not on file   Years of education: Not on file   Highest education level: Not on file  Occupational History   Not on file  Tobacco Use   Smoking status: Never   Smokeless tobacco: Never  Substance and Sexual Activity   Alcohol use: Not on file   Drug use: Yes    Sexual activity: Yes    Comment: TAH  Other Topics Concern   Not on file  Social History Narrative   Not on file   Social Determinants of Health   Financial Resource Strain: Not on file  Food Insecurity: Not on file  Transportation Needs: Not on file  Physical Activity: Not on file  Stress: Not on file  Social Connections: Not on file  Intimate Partner Violence: Not on file    Review of Systems: Positive for: GI: Described in detail in HPI.    Gen: Denies any fever, chills, rigors, night sweats, anorexia, fatigue, weakness, malaise, involuntary weight loss, and sleep disorder CV: Denies chest pain, angina, palpitations, syncope, orthopnea, PND, peripheral edema, and claudication. Resp:  dyspnea,denies cough, sputum, wheezing, coughing up blood. GU : Denies urinary burning, blood in urine, urinary frequency, urinary hesitancy, nocturnal urination, and urinary incontinence. MS: Denies joint pain or swelling.  Denies muscle weakness, cramps, atrophy.  Derm: Denies rash, itching, oral ulcerations, hives, unhealing ulcers.  Psych: Denies depression, anxiety, memory loss, suicidal ideation, hallucinations,  and confusion. Heme: Denies bruising, bleeding, and enlarged lymph nodes. Neuro:  Denies any headaches, dizziness, paresthesias. Endo:  hypothyroid, denies any problems with DM, adrenal function.  Physical Exam: Vital signs in last 24 hours: Temp:  [98.1 F (36.7 C)-98.4 F (36.9 C)] 98.4 F (36.9 C) (11/04 0926) Pulse Rate:  [59-90] 59 (11/04 0926) Resp:  [12-26] 18 (11/04 0926) BP: (90-119)/(51-65) 110/65 (11/04 0926) SpO2:  [98 %-100 %] 100 % (11/04 0926) Weight:  [79.7 kg] 79.7 kg (11/03 2039) Last BM Date :  (PTA)  General:   Alert,  Well-developed, well-nourished, pleasant and cooperative in NAD Head:  Normocephalic and atraumatic. Eyes:  Sclera clear, no icterus.   Conjunctiva pink. Ears:  Normal auditory acuity. Nose:  No deformity, discharge,  or  lesions. Mouth:  No deformity or lesions.  Oropharynx pink & moist. Neck:  Supple; no masses or thyromegaly. Lungs:  Clear throughout to auscultation.   No wheezes, crackles, or rhonchi. No acute distress. Heart:  Regular rate and rhythm; no murmurs, clicks, rubs,  or gallops. Extremities:  Without clubbing or edema. Neurologic:  Alert and  oriented x4;  grossly normal neurologically. Skin:  Intact without significant lesions or rashes. Psych:  Alert and cooperative. Normal mood and affect. Abdomen:  Soft, nontender and nondistended. No masses, hepatosplenomegaly or hernias noted. Normal bowel sounds, without guarding, and without rebound.   Rectal exam(performed in presence of patient's nurse Ulice Dash): Skin tags, external hemorrhoids, empty rectal vault, no active bleeding noted     Lab Results: Recent Labs    03/28/22 1545 03/29/22  0834  WBC 5.6 4.8  HGB 6.3* 8.8*  HCT 22.0* 28.7*  PLT 319 264   BMET Recent Labs    03/28/22 1545  NA 138  K 4.0  CL 104  CO2 24  GLUCOSE 98  BUN 8  CREATININE 0.72  CALCIUM 9.2   LFT Recent Labs    03/28/22 1545  PROT 6.7  ALBUMIN 4.0  AST 27  ALT 28  ALKPHOS 49  BILITOT 0.2*   PT/INR Recent Labs    03/28/22 1545  LABPROT 12.6  INR 1.0    Studies/Results: DG Chest 1 View  Result Date: 03/28/2022 CLINICAL DATA:  Dyspnea EXAM: CHEST  1 VIEW COMPARISON:  03/28/2022 at 1148 hours FINDINGS: The heart size and mediastinal contours are within normal limits. Both lungs are clear. The visualized skeletal structures are unremarkable. IMPRESSION: No active disease. Electronically Signed   By: Davina Poke D.O.   On: 03/28/2022 16:22    Impression: Patient with longstanding history of chronic iron deficiency anemia, admitted with symptomatic anemia and ongoing intermittent large-volume hematochezia likely related to hemorrhoids  Plan: Since patient has had a colonoscopy which showed a normal colon but had internal hemorrhoids and  patient's symptoms are compatible with hemorrhoidal bleeding, recommend surgical evaluation for hemorrhoidectomy.  Patient presented with a hemoglobin of 6.3, has received 2 unit PRBC transfusion and hemoglobin has appropriately risen to 8.8.  She has chronic iron deficiency, is unable to tolerate oral iron due to worsening constipation, ferritin is 4, iron saturation is 8, TIBC is 536, recommend IV iron infusion while in the hospital and to be continued as an outpatient.  Okay to start on a regular diet from GI standpoint. We will sign off, please recall if needed.   LOS: 1 day   Ronnette Juniper, MD  03/29/2022, 3:42 PM

## 2022-03-30 DIAGNOSIS — D649 Anemia, unspecified: Secondary | ICD-10-CM

## 2022-03-30 LAB — BPAM RBC
Blood Product Expiration Date: 202311232359
Blood Product Expiration Date: 202311232359
ISSUE DATE / TIME: 202311032052
ISSUE DATE / TIME: 202311040042
Unit Type and Rh: 6200
Unit Type and Rh: 6200

## 2022-03-30 LAB — TYPE AND SCREEN
ABO/RH(D): A POS
Antibody Screen: NEGATIVE
Unit division: 0
Unit division: 0

## 2022-03-30 MED ORDER — FERROUS SULFATE 325 (65 FE) MG PO TABS: 325.0000 mg | ORAL_TABLET | Freq: Two times a day (BID) | ORAL | 2 refills | Status: AC

## 2022-03-30 MED ORDER — SENNA 8.6 MG PO TABS: 1.0000 | ORAL_TABLET | Freq: Every day | ORAL | 0 refills | Status: AC

## 2022-03-30 MED ORDER — HYDROCORTISONE ACETATE 25 MG RE SUPP: 25.0000 mg | Freq: Two times a day (BID) | RECTAL | 0 refills | Status: AC

## 2022-03-30 NOTE — Progress Notes (Signed)
Peggy Sawyer to be discharged Home per MD order. Discussed prescriptions and follow up appointments with the patient. Prescriptions sent to pharmacy; medication list explained in detail. Patient verbalized understanding.  Skin clean, dry and intact without evidence of skin break down, no evidence of skin tears noted. IV catheter discontinued intact. Site without signs and symptoms of complications. Dressing and pressure applied. Pt denies pain at the site currently. No complaints noted.  Patient free of lines, drains, and wounds.   An After Visit Summary (AVS) was printed and given to the patient. Patient escorted via wheelchair, and discharged home via private auto.  Tried to make follow up appointments for GI, surgery, and PCP but offices are closed being Sunday.  Reinforced the importance of scheduling appointments with these doctors, patient verbalized understanding and promised to make appointments first thing in the morning tomorrow.   Amaryllis Dyke, RN

## 2022-03-30 NOTE — Discharge Summary (Incomplete)
Physician Discharge Summary  Patient ID: Peggy Sawyer MRN: [id number] DOB/AGE: [birth year] 56 y.o.  Admit date: 03/28/2022 Discharge date: 03/30/2022  Admission Diagnoses:  Discharge Diagnoses:  Principal Problem:   Symptomatic anemia Active Problems:   Iron deficiency anemia   Lower GI bleed   Discharged Condition: stable  Hospital Course: ***  Consults: GI  Significant Diagnostic Studies: {diagnostics:18242}   Discharge Exam: Blood pressure (!) 123/90, pulse 70, temperature 98 F (36.7 C), temperature source Oral, resp. rate 18, height '5\' 7"'$  (1.702 m), weight 79.7 kg, SpO2 100 %.   Disposition: Discharge disposition: 01-Home or Self Care       Discharge Instructions     Diet - low sodium heart healthy   Complete by: As directed    Increase activity slowly   Complete by: As directed       Allergies as of 03/30/2022       Reactions   Codeine    NIGHTMARES        Medication List     TAKE these medications    b complex vitamins capsule Take 1 capsule by mouth daily.   ferrous sulfate 325 (65 FE) MG tablet Take 1 tablet (325 mg total) by mouth 2 (two) times daily with a meal.   hydrocortisone 25 MG suppository Commonly known as: ANUSOL-HC Place 1 suppository (25 mg total) rectally 2 (two) times daily.   levothyroxine 100 MCG tablet Commonly known as: SYNTHROID Take 100 mcg by mouth daily before breakfast.   polyethylene glycol 17 g packet Commonly known as: MIRALAX / GLYCOLAX Take 17 g by mouth daily.   senna 8.6 MG Tabs tablet Commonly known as: SENOKOT Take 1 tablet (8.6 mg total) by mouth daily. Start taking on: March 31, 2022         Signed: Bonnell Public 03/30/2022, 4:00 PM

## 2022-03-30 NOTE — TOC Transition Note (Signed)
Transition of Care [facility]) - CM/SW Discharge Note   Patient Details  Name: Peggy Sawyer MRN: [id number] Date of Birth: [birth year]  [facility] CM/SW Contact:  Tom-Johnson, Renea Ee, RN Phone Number: 03/30/2022, 4:31 PM   Clinical Narrative:     Patient is scheduled for discharge today. No TOC needs or recommendations noted. Family to transport at discharge. No further TOC needs noted.   Final next level of care: Home/Self Care Barriers to Discharge: Barriers Resolved   Patient Goals and CMS Choice Patient states their goals for this hospitalization and ongoing recovery are:: To return home CMS Medicare.gov Compare Post Acute Care list provided to:: Patient Choice offered to / list presented to : NA  Discharge Placement                Patient to be transferred to facility by: Family      Discharge Plan and Services                DME Arranged: N/A DME Agency: NA       HH Arranged: NA HH Agency: NA        Social Determinants of Health (SDOH) Interventions     Readmission Risk Interventions     No data to display

## 2022-04-01 ENCOUNTER — Other Ambulatory Visit: Payer: Self-pay | Admitting: Internal Medicine

## 2022-04-01 DIAGNOSIS — Z85850 Personal history of malignant neoplasm of thyroid: Secondary | ICD-10-CM

## 2022-04-03 ENCOUNTER — Ambulatory Visit
Admission: RE | Admit: 2022-04-03 | Discharge: 2022-04-03 | Disposition: A | Discharge status: Home / Self Care | Payer: Managed Care, Other (non HMO) | Source: Ambulatory Visit | Attending: Internal Medicine | Admitting: Internal Medicine

## 2022-04-03 DIAGNOSIS — Z85850 Personal history of malignant neoplasm of thyroid: Secondary | ICD-10-CM

## 2022-05-13 NOTE — Discharge Summary (Signed)
Physician Discharge Summary  Patient ID: Peggy Sawyer MRN: [id number] DOB/AGE: February 20, 1966 56 y.o.  Admit date: 03/28/2022 Discharge date: 03/30/2022  Admission Diagnoses:  Discharge Diagnoses:  Principal Problem:   Symptomatic anemia Active Problems:   Iron deficiency anemia   Lower GI bleed   Discharged Condition: stable  Hospital Course: Patient is a 56 year old female with history of intermittent rectal bleeds, chronic anemia, intermittent constipation and colonoscopy proven internal hemorrhoids.  Apparently, patient has had colonoscopy and upper GI scope in the past.  Last colonoscopy was in 2022 and revealed internal hemorrhoids.  Patient presented with symptomatic anemia.  Patient's history and clinical syndrome were suggestive of hemorrhoidal bleed.  On presentation, hemoglobin was 6.3 g/dL.  Hemoglobin was noted to be 13.5 g/dL on 03/11/2011.  GI team was consulted to assist with patient's management.  Patient was transfused with 2 units of packed red blood cells.  Hemoglobin improved to 8.8 g/dL.  GI team has advised surgical follow-up upon discharge as patient will likely need hemorrhoidectomy.  Patient is back to her normal state of health and will be discharged back to the care of the primary care provider and GI team.  Patient will follow-up with surgical team on discharge.  Patient has verbalized understanding of need to follow-up with surgical team for hemorrhoidectomy.  Patient will need to avoid constipation.  Patient will be discharged on iron and supplements.  Consults: GI  Significant Diagnostic Studies:    Discharge Exam: Blood pressure (!) 123/90, pulse 70, temperature 98 F (36.7 C), temperature source Oral, resp. rate 18, height '5\' 7"'$  (1.702 m), weight 79.7 kg, SpO2 100 %.   Disposition: Discharge disposition: 01-Home or Self Care       Discharge Instructions     Diet - low sodium heart healthy   Complete by: As directed    Increase activity slowly    Complete by: As directed       Allergies as of 03/30/2022       Reactions   Codeine    NIGHTMARES        Medication List     TAKE these medications    b complex vitamins capsule Take 1 capsule by mouth daily.   ferrous sulfate 325 (65 FE) MG tablet Take 1 tablet (325 mg total) by mouth 2 (two) times daily with a meal.   hydrocortisone 25 MG suppository Commonly known as: ANUSOL-HC Place 1 suppository (25 mg total) rectally 2 (two) times daily.   levothyroxine 100 MCG tablet Commonly known as: SYNTHROID Take 100 mcg by mouth daily before breakfast.   polyethylene glycol 17 g packet Commonly known as: MIRALAX / GLYCOLAX Take 17 g by mouth daily.   senna 8.6 MG Tabs tablet Commonly known as: SENOKOT Take 1 tablet (8.6 mg total) by mouth daily.         SignedBonnell Public 05/13/2022, 5:49 AM

## 2022-07-17 ENCOUNTER — Other Ambulatory Visit: Payer: Self-pay | Admitting: Family Medicine

## 2022-07-17 DIAGNOSIS — Z1231 Encounter for screening mammogram for malignant neoplasm of breast: Secondary | ICD-10-CM

## 2022-08-28 ENCOUNTER — Ambulatory Visit
Admission: RE | Admit: 2022-08-28 | Discharge: 2022-08-28 | Disposition: A | Discharge status: Home / Self Care | Payer: Managed Care, Other (non HMO) | Source: Ambulatory Visit | Attending: Family Medicine | Admitting: Family Medicine

## 2022-08-28 DIAGNOSIS — Z1231 Encounter for screening mammogram for malignant neoplasm of breast: Secondary | ICD-10-CM

## 2023-06-24 ENCOUNTER — Other Ambulatory Visit: Payer: Self-pay | Admitting: Family Medicine

## 2023-06-24 DIAGNOSIS — Z1231 Encounter for screening mammogram for malignant neoplasm of breast: Secondary | ICD-10-CM

## 2023-08-31 ENCOUNTER — Ambulatory Visit
Admission: RE | Admit: 2023-08-31 | Discharge: 2023-08-31 | Disposition: A | Discharge status: Home / Self Care | Payer: Managed Care, Other (non HMO) | Source: Ambulatory Visit | Attending: Family Medicine | Admitting: Family Medicine

## 2023-08-31 DIAGNOSIS — Z1231 Encounter for screening mammogram for malignant neoplasm of breast: Secondary | ICD-10-CM
